# Patient Record
Sex: Male | Born: 1970 | Race: Black or African American | Hispanic: No | Marital: Single | Smoking: Former smoker
Health system: Southern US, Community
[De-identification: ages and names within clinical notes are randomized; demographics above are authoritative.]

## PROBLEM LIST (undated history)

## (undated) ENCOUNTER — Ambulatory Visit: Source: Home / Self Care

---

## 1998-01-29 ENCOUNTER — Emergency Department (HOSPITAL_COMMUNITY): Admission: EM | Admit: 1998-01-29 | Discharge: 1998-01-29 | Payer: Self-pay | Admitting: Emergency Medicine

## 2001-03-01 ENCOUNTER — Emergency Department (HOSPITAL_COMMUNITY): Admission: EM | Admit: 2001-03-01 | Discharge: 2001-03-01 | Payer: Self-pay | Admitting: Emergency Medicine

## 2001-03-01 ENCOUNTER — Encounter: Payer: Self-pay | Admitting: Emergency Medicine

## 2001-03-05 ENCOUNTER — Emergency Department (HOSPITAL_COMMUNITY): Admission: EM | Admit: 2001-03-05 | Discharge: 2001-03-05 | Payer: Self-pay

## 2001-10-26 ENCOUNTER — Emergency Department (HOSPITAL_COMMUNITY): Admission: EM | Admit: 2001-10-26 | Discharge: 2001-10-26 | Payer: Self-pay | Admitting: Emergency Medicine

## 2001-10-26 ENCOUNTER — Encounter: Payer: Self-pay | Admitting: Emergency Medicine

## 2006-03-27 ENCOUNTER — Emergency Department (HOSPITAL_COMMUNITY): Admission: EM | Admit: 2006-03-27 | Discharge: 2006-03-27 | Payer: Self-pay | Admitting: Emergency Medicine

## 2006-05-10 ENCOUNTER — Emergency Department (HOSPITAL_COMMUNITY): Admission: EM | Admit: 2006-05-10 | Discharge: 2006-05-11 | Payer: Self-pay | Admitting: Emergency Medicine

## 2006-10-16 ENCOUNTER — Emergency Department (HOSPITAL_COMMUNITY): Admission: EM | Admit: 2006-10-16 | Discharge: 2006-10-16 | Payer: Self-pay | Admitting: Emergency Medicine

## 2006-10-19 ENCOUNTER — Emergency Department (HOSPITAL_COMMUNITY): Admission: EM | Admit: 2006-10-19 | Discharge: 2006-10-19 | Payer: Self-pay | Admitting: Family Medicine

## 2007-09-06 ENCOUNTER — Ambulatory Visit: Payer: Self-pay | Admitting: Nurse Practitioner

## 2007-10-14 ENCOUNTER — Ambulatory Visit: Payer: Self-pay | Admitting: Internal Medicine

## 2007-11-05 ENCOUNTER — Ambulatory Visit: Payer: Self-pay | Admitting: Internal Medicine

## 2007-11-05 ENCOUNTER — Ambulatory Visit: Payer: Self-pay | Admitting: *Deleted

## 2007-12-17 ENCOUNTER — Encounter (INDEPENDENT_AMBULATORY_CARE_PROVIDER_SITE_OTHER): Payer: Self-pay | Admitting: Nurse Practitioner

## 2007-12-17 ENCOUNTER — Ambulatory Visit: Payer: Self-pay | Admitting: Internal Medicine

## 2007-12-17 LAB — CONVERTED CEMR LAB
AST: 25 units/L (ref 0–37)
Albumin: 4.3 g/dL (ref 3.5–5.2)
BUN: 17 mg/dL (ref 6–23)
Basophils Relative: 0 % (ref 0–1)
CO2: 25 meq/L (ref 19–32)
Calcium: 9.2 mg/dL (ref 8.4–10.5)
Chloride: 106 meq/L (ref 96–112)
Cholesterol: 209 mg/dL — ABNORMAL HIGH (ref 0–200)
HDL: 43 mg/dL (ref >39)
Hemoglobin: 14.6 g/dL (ref 13.0–17.0)
Lymphocytes Relative: 33 % (ref 12–46)
Lymphs Abs: 2.3 10*3/uL (ref 0.7–4.0)
Microalb, Ur: 5.75 mg/dL — ABNORMAL HIGH (ref 0.00–1.89)
Monocytes Relative: 10 % (ref 3–12)
Neutro Abs: 3.9 10*3/uL (ref 1.7–7.7)
Neutrophils Relative %: 56 % (ref 43–77)
Potassium: 4.7 meq/L (ref 3.5–5.3)
RBC: 5.45 M/uL (ref 4.22–5.81)
TSH: 2.235 microintl units/mL (ref 0.350–5.50)
WBC: 6.9 10*3/uL (ref 4.0–10.5)

## 2007-12-20 ENCOUNTER — Encounter (INDEPENDENT_AMBULATORY_CARE_PROVIDER_SITE_OTHER): Payer: Self-pay | Admitting: Nurse Practitioner

## 2007-12-20 LAB — CONVERTED CEMR LAB: Hgb A1c MFr Bld: 6.9 % — ABNORMAL HIGH (ref 4.6–6.1)

## 2008-01-14 ENCOUNTER — Emergency Department (HOSPITAL_COMMUNITY): Admission: EM | Admit: 2008-01-14 | Discharge: 2008-01-14 | Payer: Self-pay | Admitting: Emergency Medicine

## 2008-07-14 ENCOUNTER — Ambulatory Visit: Payer: Self-pay | Admitting: Internal Medicine

## 2008-07-14 ENCOUNTER — Encounter (INDEPENDENT_AMBULATORY_CARE_PROVIDER_SITE_OTHER): Payer: Self-pay | Admitting: Internal Medicine

## 2008-07-14 LAB — CONVERTED CEMR LAB
Barbiturate Quant, Ur: NEGATIVE
Creatinine,U: 378.1 mg/dL
Microalb, Ur: 4.53 mg/dL — ABNORMAL HIGH (ref 0.00–1.89)
Opiate Screen, Urine: NEGATIVE
Phencyclidine (PCP): NEGATIVE
Propoxyphene: NEGATIVE

## 2008-07-24 ENCOUNTER — Ambulatory Visit (HOSPITAL_BASED_OUTPATIENT_CLINIC_OR_DEPARTMENT_OTHER): Admission: RE | Admit: 2008-07-24 | Discharge: 2008-07-24 | Payer: Self-pay | Admitting: Internal Medicine

## 2008-07-29 ENCOUNTER — Ambulatory Visit: Payer: Self-pay | Admitting: Internal Medicine

## 2008-08-15 ENCOUNTER — Encounter (INDEPENDENT_AMBULATORY_CARE_PROVIDER_SITE_OTHER): Payer: Self-pay | Admitting: Internal Medicine

## 2008-08-15 ENCOUNTER — Ambulatory Visit: Payer: Self-pay | Admitting: Internal Medicine

## 2008-08-15 LAB — CONVERTED CEMR LAB
Calcium: 8.9 mg/dL (ref 8.4–10.5)
Creatinine, Ser: 1.24 mg/dL (ref 0.40–1.50)
HDL: 34 mg/dL — ABNORMAL LOW (ref >39)
Triglycerides: 141 mg/dL (ref <150)

## 2009-06-27 ENCOUNTER — Emergency Department (HOSPITAL_COMMUNITY): Admission: EM | Admit: 2009-06-27 | Discharge: 2009-06-27 | Payer: Self-pay | Admitting: Emergency Medicine

## 2009-06-28 ENCOUNTER — Emergency Department (HOSPITAL_COMMUNITY): Admission: EM | Admit: 2009-06-28 | Discharge: 2009-06-29 | Payer: Self-pay | Admitting: Emergency Medicine

## 2009-07-10 ENCOUNTER — Ambulatory Visit: Payer: Self-pay | Admitting: Internal Medicine

## 2010-02-23 ENCOUNTER — Observation Stay (HOSPITAL_COMMUNITY): Admission: EM | Admit: 2010-02-23 | Discharge: 2010-02-24 | Payer: Self-pay | Admitting: Emergency Medicine

## 2010-08-29 ENCOUNTER — Emergency Department (HOSPITAL_COMMUNITY): Admission: EM | Admit: 2010-08-29 | Discharge: 2010-06-03 | Payer: Self-pay | Admitting: Emergency Medicine

## 2010-12-09 LAB — DIFFERENTIAL
Basophils Absolute: 0.1 10*3/uL (ref 0.0–0.1)
Basophils Relative: 1 % (ref 0–1)
Eosinophils Relative: 1 % (ref 0–5)
Lymphocytes Relative: 12 % (ref 12–46)
Monocytes Absolute: 0.9 10*3/uL (ref 0.1–1.0)

## 2010-12-09 LAB — CBC
HCT: 38.4 % — ABNORMAL LOW (ref 39.0–52.0)
Hemoglobin: 12.8 g/dL — ABNORMAL LOW (ref 13.0–17.0)
MCHC: 33.4 g/dL (ref 30.0–36.0)
RDW: 14 % (ref 11.5–15.5)

## 2010-12-09 LAB — BASIC METABOLIC PANEL
CO2: 27 mEq/L (ref 19–32)
Chloride: 107 mEq/L (ref 96–112)
Glucose, Bld: 102 mg/dL — ABNORMAL HIGH (ref 70–99)
Potassium: 3.8 mEq/L (ref 3.5–5.1)
Sodium: 137 mEq/L (ref 135–145)

## 2010-12-09 LAB — URINALYSIS, ROUTINE W REFLEX MICROSCOPIC
Bilirubin Urine: NEGATIVE
Ketones, ur: NEGATIVE mg/dL
Nitrite: NEGATIVE
pH: 8 (ref 5.0–8.0)

## 2010-12-26 LAB — URINALYSIS, ROUTINE W REFLEX MICROSCOPIC
Bilirubin Urine: NEGATIVE
Glucose, UA: NEGATIVE mg/dL
Hgb urine dipstick: NEGATIVE
Specific Gravity, Urine: 1.022 (ref 1.005–1.030)
Urobilinogen, UA: 0.2 mg/dL (ref 0.0–1.0)
pH: 5.5 (ref 5.0–8.0)

## 2010-12-26 LAB — DIFFERENTIAL
Basophils Relative: 1 % (ref 0–1)
Eosinophils Absolute: 0.2 10*3/uL (ref 0.0–0.7)
Eosinophils Relative: 2 % (ref 0–5)
Monocytes Absolute: 0.8 10*3/uL (ref 0.1–1.0)
Monocytes Relative: 10 % (ref 3–12)

## 2010-12-26 LAB — POCT I-STAT, CHEM 8
Calcium, Ion: 1.19 mmol/L (ref 1.12–1.32)
Creatinine, Ser: 1.2 mg/dL (ref 0.4–1.5)
Glucose, Bld: 101 mg/dL — ABNORMAL HIGH (ref 70–99)
HCT: 43 % (ref 39.0–52.0)
Hemoglobin: 14.6 g/dL (ref 13.0–17.0)

## 2010-12-26 LAB — CBC
HCT: 40 % (ref 39.0–52.0)
Hemoglobin: 13.3 g/dL (ref 13.0–17.0)
MCHC: 33.2 g/dL (ref 30.0–36.0)
MCV: 83.7 fL (ref 78.0–100.0)
RBC: 4.78 MIL/uL (ref 4.22–5.81)

## 2011-02-04 NOTE — Procedures (Signed)
NAME:  Andre Weiss, Andre Weiss NO.:  [id number]   MEDICAL RECORD NO.:  [id number]          PATIENT TYPE:  OUT   LOCATION:  SLEEP CENTER                 FACILITY:  [facility]   PHYSICIAN:  Clinton D. Maple Hudson, MD, FCCP, FACPDATE OF BIRTH:  12-May[birth year]1970   DATE OF STUDY:  07/24/2008                            NOCTURNAL POLYSOMNOGRAM   REFERRING PHYSICIAN:  Dineen Kid. Reche Dixon, M.D.   REFERRING PHYSICIAN:  Dr. Dineen Kid. Reche Dixon, MD   INDICATION FOR STUDY:  Hypersomnia with sleep apnea.   EPWORTH SLEEPINESS SCORE:  Epworth sleepiness score 6/24.  BMI 35.8.  Weight 260 pounds.  Height 71.5 inches.  Neck 18.5 inches.   MEDICATIONS:  Home medications are charted and reviewed.   SLEEP ARCHITECTURE:  Split study protocol.  During the diagnostic phase  total sleep time was 131 minutes with sleep efficiency 89.8%.  Stage I  was 4.6%. Stage II  94.7%.  Stage III 0.8%.  REM absent.  Sleep latency  is 14.5 minutes.  Wake after sleep onset 1 minute.  Arousal index 76.7  indicating increased EEG arousal.  No bedtime medication was reported.   RESPIRATORY DATA:  Split study protocol.  Apnea-hypopnea index (AHI)  107.2 per hour.  A total of 235 events were scored including 84  obstructive apneas and 151 hypopneas.  All events were listed as being  nonsupine.  REM AHI N/A.  CPAP was titrated to 14 CWP, AHI 0 per hour.  He chose a medium ResMed full-face mask with heated humidifier.   OXYGEN DATA:  Loud snoring before CPAP with oxygen desaturation to a  nadir of 74%.  After CPAP titration mean oxygen saturation held 97.4% on  room air.   CARDIAC DATA:  Normal sinus rhythm.   MOVEMENT/PARASOMNIA:  Insignificant movement disturbance.  Bathroom x1.   IMPRESSIONS/RECOMMENDATIONS:  1. Severe obstructive sleep apnea/hypopnea syndrome, apnea-hypopnea      index 107.2 per hour with events while nonsupine, loud snoring, and      oxygen desaturation to a nadir of 74%.  2. Successful continuous positive  airway pressure titration to 14      centimeters of water pressure, apnea-hypopnea index 0 per hour.  He      chose a medium ResMed full-face mask with heated humidifier.      Clinton D. Maple Hudson, MD, [facility], FACP  Diplomate, Biomedical engineer of Sleep Medicine  Electronically Signed     CDY/MEDQ  D:  07/29/2008 16:10:07  T:  07/30/2008 01:32:36  Job:  [id number]

## 2012-03-30 ENCOUNTER — Emergency Department (HOSPITAL_COMMUNITY)
Admission: EM | Admit: 2012-03-30 | Discharge: 2012-03-31 | Disposition: A | Discharge status: Home / Self Care | Payer: Self-pay | Attending: Emergency Medicine | Admitting: Emergency Medicine

## 2012-03-30 ENCOUNTER — Encounter (HOSPITAL_COMMUNITY): Payer: Self-pay | Admitting: *Deleted

## 2012-03-30 DIAGNOSIS — H109 Unspecified conjunctivitis: Secondary | ICD-10-CM | POA: Insufficient documentation

## 2012-03-30 DIAGNOSIS — H53149 Visual discomfort, unspecified: Secondary | ICD-10-CM | POA: Insufficient documentation

## 2012-03-30 DIAGNOSIS — H5789 Other specified disorders of eye and adnexa: Secondary | ICD-10-CM | POA: Insufficient documentation

## 2012-03-30 DIAGNOSIS — H5710 Ocular pain, unspecified eye: Secondary | ICD-10-CM | POA: Insufficient documentation

## 2012-03-30 MED ORDER — TOBRAMYCIN 0.3 % OP SOLN
2.0000 [drp] | OPHTHALMIC | Status: DC
  Administered 2012-03-31: 2 [drp] via OPHTHALMIC
  Filled 2012-03-30: qty 5

## 2012-03-30 MED ORDER — FLUORESCEIN SODIUM 1 MG OP STRP
ORAL_STRIP | OPHTHALMIC | Status: AC
  Administered 2012-03-30
  Filled 2012-03-30: qty 2

## 2012-03-30 MED ORDER — TETRACAINE HCL 0.5 % OP SOLN
2.0000 [drp] | Freq: Once | OPHTHALMIC | Status: AC
  Administered 2012-03-30: 2 [drp] via OPHTHALMIC
  Filled 2012-03-30: qty 2

## 2012-03-30 NOTE — ED Provider Notes (Signed)
History     CSN: [id number]  Arrival date & time 03/30/12  2019   First MD Initiated Contact with Patient 03/30/12 2233      Chief Complaint  Patient presents with  . Eye Pain   HPI  History provided by the patient. Patient is a 41 year old male with no significant past medical history who presents with complaints of left eye pain and redness for the past day. Patient does not recall having any foreign body in the high. He does report using insecticide spray 2 days ago but did not have any eye complaints that day. Pain is worse with bright lights and occasionally with eye movements. Patient has otherwise felt well. Symptoms are also associated with redness to the eye and tearing and discharge. Symptoms have not been associated with any double vision or blurry vision. He denies any nasal congestion, rhinorrhea, cough, fever, chills.    History reviewed. No pertinent past medical history.  History reviewed. No pertinent past surgical history.  History reviewed. No pertinent family history.  History  Substance Use Topics  . Smoking status: Never Smoker   . Smokeless tobacco: Not on file  . Alcohol Use: No      Review of Systems  Constitutional: Negative for fever and chills.  Eyes: Positive for photophobia, pain, discharge and redness. Negative for itching and visual disturbance.  Gastrointestinal: Negative for nausea and vomiting.  Neurological: Negative for dizziness, light-headedness and headaches.    Allergies  Review of patient's allergies indicates no known allergies.  Home Medications   Current Outpatient Rx  Name Route Sig Dispense Refill  . ADULT MULTIVITAMIN W/MINERALS CH Oral Take 1 tablet by mouth daily.      BP 161/97  Pulse 81  Temp 97.9 F (36.6 C) (Oral)  Resp 19  SpO2 100%  Physical Exam  Nursing note and vitals reviewed. Constitutional: He is oriented to person, place, and time. He appears well-developed and well-nourished.  HENT:  Head:  Normocephalic.  Eyes: EOM are normal. Pupils are equal, round, and reactive to light. No foreign bodies found. Left eye exhibits discharge. Left eye exhibits no chemosis. No foreign body present in the left eye. Left conjunctiva is injected. Left conjunctiva has no hemorrhage.  Slit lamp exam:      The left eye shows no corneal abrasion, no corneal flare, no corneal ulcer, no foreign body, no hyphema and no fluorescein uptake.  Cardiovascular: Normal rate and regular rhythm.   Pulmonary/Chest: Effort normal and breath sounds normal.  Abdominal: Soft.  Neurological: He is alert and oriented to person, place, and time.  Skin: Skin is warm.  Psychiatric: He has a normal mood and affect. His behavior is normal.    ED Course  Procedures     1. Conjunctivitis of left eye       MDM  11:05PM patient seen and evaluated. Patient no acute distress.        Angus Seller, [place] 03/31/12 [phone]

## 2012-03-30 NOTE — ED Notes (Signed)
Pt c/o left eye pain and photophobia, redness and tearing up.  Denies HA, n/v, SOB, CP.

## 2012-03-31 MED ORDER — HYDROCODONE-ACETAMINOPHEN 5-325 MG PO TABS: 1.0000 | ORAL_TABLET | Freq: Four times a day (QID) | ORAL | Status: AC | PRN

## 2012-03-31 MED ORDER — HYDROCODONE-ACETAMINOPHEN 5-325 MG PO TABS
1.0000 | ORAL_TABLET | Freq: Once | ORAL | Status: AC
  Administered 2012-03-31: 1 via ORAL
  Filled 2012-03-31: qty 1

## 2012-03-31 NOTE — ED Provider Notes (Signed)
Medical screening examination/treatment/procedure(s) were performed by non-physician practitioner and as supervising physician I was immediately available for consultation/collaboration.    Jayvin Hurrell R Kirbie Stodghill, MD 03/31/12 1504 

## 2012-04-23 ENCOUNTER — Emergency Department (HOSPITAL_COMMUNITY): Payer: No Typology Code available for payment source

## 2012-04-23 ENCOUNTER — Emergency Department (HOSPITAL_COMMUNITY)
Admission: EM | Admit: 2012-04-23 | Discharge: 2012-04-23 | Disposition: A | Discharge status: Home / Self Care | Payer: No Typology Code available for payment source | Attending: Emergency Medicine | Admitting: Emergency Medicine

## 2012-04-23 ENCOUNTER — Encounter (HOSPITAL_COMMUNITY): Payer: Self-pay | Admitting: *Deleted

## 2012-04-23 DIAGNOSIS — M545 Low back pain, unspecified: Secondary | ICD-10-CM

## 2012-04-23 DIAGNOSIS — M25512 Pain in left shoulder: Secondary | ICD-10-CM

## 2012-04-23 DIAGNOSIS — Y92410 Unspecified street and highway as the place of occurrence of the external cause: Secondary | ICD-10-CM | POA: Insufficient documentation

## 2012-04-23 DIAGNOSIS — M25519 Pain in unspecified shoulder: Secondary | ICD-10-CM | POA: Insufficient documentation

## 2012-04-23 DIAGNOSIS — M546 Pain in thoracic spine: Secondary | ICD-10-CM

## 2012-04-23 MED ORDER — MORPHINE SULFATE 4 MG/ML IJ SOLN
4.0000 mg | Freq: Once | INTRAMUSCULAR | Status: AC
  Administered 2012-04-23: 4 mg via INTRAMUSCULAR
  Filled 2012-04-23: qty 1

## 2012-04-23 MED ORDER — OXYCODONE-ACETAMINOPHEN 5-325 MG PO TABS: 1.0000 | ORAL_TABLET | ORAL | Status: AC | PRN

## 2012-04-23 MED ORDER — MORPHINE SULFATE 4 MG/ML IJ SOLN: 4.0000 mg | Freq: Once | INTRAMUSCULAR | Status: DC

## 2012-04-23 MED ORDER — ONDANSETRON 4 MG PO TBDP
8.0000 mg | ORAL_TABLET | Freq: Once | ORAL | Status: AC
  Administered 2012-04-23: 8 mg via ORAL
  Filled 2012-04-23: qty 2

## 2012-04-23 MED ORDER — SODIUM CHLORIDE 0.9 % IV SOLN: Freq: Once | INTRAVENOUS | Status: DC

## 2012-04-23 MED ORDER — ACETAMINOPHEN 325 MG PO TABS
975.0000 mg | ORAL_TABLET | Freq: Once | ORAL | Status: DC
  Filled 2012-04-23: qty 3

## 2012-04-23 MED ORDER — ONDANSETRON HCL 4 MG/2ML IJ SOLN: 4.0000 mg | Freq: Once | INTRAMUSCULAR | Status: DC

## 2012-04-23 NOTE — ED Notes (Signed)
c-collar removed by PA, HOB raised to 15 degrees per pt request, lights dimmed.

## 2012-04-23 NOTE — ED Notes (Signed)
Pt declined acetaminophen. Requested "something stronger, at least ibuprofen", "my pain is 10/10 & has been since arrival" (a change from initial assessment with this RN), alert, NAD, calm, interactive, slow to come up with date (stated July). Son at [facility]. Pt will call for ride.

## 2012-04-23 NOTE — ED Notes (Signed)
To x-ray via stretcher.
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
## 2012-04-24 NOTE — ED Provider Notes (Signed)
Medical screening examination/treatment/procedure(s) were performed by non-physician practitioner and as supervising physician I was immediately available for consultation/collaboration.  Martha K Linker, MD 04/24/12 1748 

## 2012-04-29 ENCOUNTER — Emergency Department (INDEPENDENT_AMBULATORY_CARE_PROVIDER_SITE_OTHER)
Admission: EM | Admit: 2012-04-29 | Discharge: 2012-04-29 | Disposition: A | Discharge status: Home / Self Care | Payer: Self-pay | Source: Home / Self Care | Attending: Emergency Medicine | Admitting: Emergency Medicine

## 2012-04-29 ENCOUNTER — Encounter (HOSPITAL_COMMUNITY): Payer: Self-pay | Admitting: *Deleted

## 2012-04-29 DIAGNOSIS — IMO0002 Reserved for concepts with insufficient information to code with codable children: Secondary | ICD-10-CM

## 2012-04-29 DIAGNOSIS — T148XXA Other injury of unspecified body region, initial encounter: Secondary | ICD-10-CM

## 2012-04-29 MED ORDER — HYDROCODONE-ACETAMINOPHEN 5-500 MG PO TABS: 1.0000 | ORAL_TABLET | Freq: Four times a day (QID) | ORAL | Status: AC | PRN

## 2012-04-29 MED ORDER — CYCLOBENZAPRINE HCL 10 MG PO TABS: 10.0000 mg | ORAL_TABLET | Freq: Every evening | ORAL | Status: AC | PRN
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
## 2014-03-24 ENCOUNTER — Emergency Department (HOSPITAL_COMMUNITY): Payer: No Typology Code available for payment source

## 2014-03-24 ENCOUNTER — Emergency Department (HOSPITAL_COMMUNITY)
Admission: EM | Admit: 2014-03-24 | Discharge: 2014-03-24 | Disposition: A | Discharge status: Home / Self Care | Payer: No Typology Code available for payment source | Attending: Emergency Medicine | Admitting: Emergency Medicine

## 2014-03-24 ENCOUNTER — Encounter (HOSPITAL_COMMUNITY): Payer: Self-pay | Admitting: Emergency Medicine

## 2014-03-24 DIAGNOSIS — Y9389 Activity, other specified: Secondary | ICD-10-CM | POA: Insufficient documentation

## 2014-03-24 DIAGNOSIS — Z79899 Other long term (current) drug therapy: Secondary | ICD-10-CM | POA: Insufficient documentation

## 2014-03-24 DIAGNOSIS — S93409A Sprain of unspecified ligament of unspecified ankle, initial encounter: Secondary | ICD-10-CM | POA: Insufficient documentation

## 2014-03-24 DIAGNOSIS — S93402A Sprain of unspecified ligament of left ankle, initial encounter: Secondary | ICD-10-CM

## 2014-03-24 DIAGNOSIS — X500XXA Overexertion from strenuous movement or load, initial encounter: Secondary | ICD-10-CM | POA: Insufficient documentation

## 2014-03-24 DIAGNOSIS — Y929 Unspecified place or not applicable: Secondary | ICD-10-CM | POA: Insufficient documentation

## 2014-03-24 MED ORDER — OXYCODONE-ACETAMINOPHEN 5-325 MG PO TABS
1.0000 | ORAL_TABLET | Freq: Four times a day (QID) | ORAL | Status: DC | PRN
Start: 2014-03-24 — End: 2014-05-22

## 2014-03-24 MED ORDER — KETOROLAC TROMETHAMINE 60 MG/2ML IM SOLN
60.0000 mg | Freq: Once | INTRAMUSCULAR | Status: AC
  Administered 2014-03-24: 60 mg via INTRAMUSCULAR
  Filled 2014-03-24: qty 2
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
## 2014-03-24 NOTE — Discharge Instructions (Signed)
Wear ankle brace for at least 2 weeks for stabilization of ankle. Use crutches as needed for comfort, start weight bearing over the next week. Ice and elevate ankle throughout the day. Alternate between ibuprofen and Percocet for pain relief. Do not drive or operate machinery with pain medication use. Call orthopedic follow up today or tomorrow to schedule followup appointment in 2 weeks for recheck of ongoing ankle pain that can be canceled with a 24-48 hour notice if complete resolution of pain. Follow up with your primary care doctor in 1 week for further evaluation.   Ankle Sprain An ankle sprain is an injury to the strong, fibrous tissues (ligaments) that hold your ankle bones together.  HOME CARE   Put ice on your ankle for 1-2 days or as told by your doctor.  Put ice in a plastic bag.  Place a towel between your skin and the bag.  Leave the ice on for 15-20 minutes at a time, every 2 hours while you are awake.  Only take medicine as told by your doctor.  Raise (elevate) your injured ankle above the level of your heart as much as possible for 2-3 days.  Use crutches if your doctor tells you to. Slowly put your own weight on the affected ankle. Use the crutches until you can walk without pain.  If you have a plaster splint:  Do not rest it on anything harder than a pillow for 24 hours.  Do not put weight on it.  Do not get it wet.  Take it off to shower or bathe.  If given, use an elastic wrap or support stocking for support. Take the wrap off if your toes lose feeling (numb), tingle, or turn cold or blue.  If you have an air splint:  Add or let out air to make it comfortable.  Take it off at night and to shower and bathe.  Wiggle your toes and move your ankle up and down often while you are wearing it. GET HELP RIGHT AWAY IF:   Your toes lose feeling (numb) or turn blue.  You have severe pain that is increasing.  You have rapidly increasing bruising or puffiness  (swelling).  Your toes feel very cold.  You lose feeling in your foot.  Your medicine does not help your pain. MAKE SURE YOU:   Understand these instructions.  Will watch your condition.  Will get help right away if you are not doing well or get worse. Document Released: 02/25/2008 Document Revised: 06/02/2012 Document Reviewed: 03/22/2012 [facility] Patient Information 2015 [place]ExitCare, [place]LL[place]. This information is not intended to replace advice given to you by your health care provider. Make sure you discuss any questions you have with your health care provider.  Cryotherapy Cryotherapy means treatment with cold. Ice or gel packs can be used to reduce both pain and swelling. Ice is the most helpful within the first 24 to 48 hours after an injury or flareup from overusing a muscle or joint. Sprains, strains, spasms, burning pain, shooting pain, and aches can all be eased with ice. Ice can also be used when recovering from surgery. Ice is effective, has very few side effects, and is safe for most people to use. PRECAUTIONS  Ice is not a safe treatment option for people with:  Raynaud's phenomenon. This is a condition affecting small blood vessels in the extremities. Exposure to cold may cause your problems to return.  Cold hypersensitivity. There are many forms of cold hypersensitivity, including:  Cold  urticaria. Red, itchy hives appear on the skin when the tissues begin to warm after being iced.  Cold erythema. This is a red, itchy rash caused by exposure to cold.  Cold hemoglobinuria. Red blood cells break down when the tissues begin to warm after being iced. The hemoglobin that carry oxygen are passed into the urine because they cannot combine with blood proteins fast enough.  Numbness or altered sensitivity in the area being iced. If you have any of the following conditions, do not use ice until you have discussed cryotherapy with your caregiver:  Heart conditions, such as  arrhythmia, angina, or chronic heart disease.  High blood pressure.  Healing wounds or open skin in the area being iced.  Current infections.  Rheumatoid arthritis.  Poor circulation.  Diabetes. Ice slows the blood flow in the region it is applied. This is beneficial when trying to stop inflamed tissues from spreading irritating chemicals to surrounding tissues. However, if you expose your skin to cold temperatures for too long or without the proper protection, you can damage your skin or nerves. Watch for signs of skin damage due to cold. HOME CARE INSTRUCTIONS Follow these tips to use ice and cold packs safely.  Place a dry or damp towel between the ice and skin. A damp towel will cool the skin more quickly, so you may need to shorten the time that the ice is used.  For a more rapid response, add gentle compression to the ice.  Ice for no more than 10 to 20 minutes at a time. The bonier the area you are icing, the less time it will take to get the benefits of ice.  Check your skin after 5 minutes to make sure there are no signs of a poor response to cold or skin damage.  Rest 20 minutes or more in between uses.  Once your skin is numb, you can end your treatment. You can test numbness by very lightly touching your skin. The touch should be so light that you do not see the skin dimple from the pressure of your fingertip. When using ice, most people will feel these normal sensations in this order: cold, burning, aching, and numbness.  Do not use ice on someone who cannot communicate their responses to pain, such as small children or people with dementia. HOW TO MAKE AN ICE PACK Ice packs are the most common way to use ice therapy. Other methods include ice massage, ice baths, and cryo-sprays. Muscle creams that cause a cold, tingly feeling do not offer the same benefits that ice offers and should not be used as a substitute unless recommended by your caregiver. To make an ice pack, do  one of the following:  Place crushed ice or a bag of frozen vegetables in a sealable plastic bag. Squeeze out the excess air. Place this bag inside another plastic bag. Slide the bag into a pillowcase or place a damp towel between your skin and the bag.  Mix 3 parts water with 1 part rubbing alcohol. Freeze the mixture in a sealable plastic bag. When you remove the mixture from the freezer, it will be slushy. Squeeze out the excess air. Place this bag inside another plastic bag. Slide the bag into a pillowcase or place a damp towel between your skin and the bag. SEEK MEDICAL CARE IF:  You develop white spots on your skin. This may give the skin a blotchy (mottled) appearance.  Your skin turns blue or pale.  Your skin  becomes waxy or hard.  Your swelling gets worse. MAKE SURE YOU:   Understand these instructions.  Will watch your condition.  Will get help right away if you are not doing well or get worse. Document Released: 05/05/2011 Document Revised: 12/01/2011 Document Reviewed: 05/05/2011 [facility] Patient Information 2015 [place]Care, [place]. This information is not intended to replace advice given to you by your health care provider. Make sure you discuss any questions you have with your health care provider.  Ligament Sprain Ligaments are tough, fibrous tissues that hold bones together at the joints. A sprain can occur when a ligament is stretched. This injury may take several weeks to heal. HOME CARE INSTRUCTIONS   Rest the injured area for as long as directed by your caregiver. Then slowly start using the joint as directed by your caregiver and as the pain allows.  Keep the affected joint raised if possible to lessen swelling.  Apply ice for 15-20 minutes to the injured area every couple hours for the first half day, then 03-04 times per day for the first 48 hours. Put the ice in a plastic bag and place a towel between the bag of ice and your skin.  Wear any splinting, casting,  or elastic bandage applications as instructed.  Only take over-the-counter or prescription medicines for pain, discomfort, or fever as directed by your caregiver. Do not use aspirin immediately after the injury unless instructed by your caregiver. Aspirin can cause increased bleeding and bruising of the tissues.  If you were given crutches, continue to use them as instructed and do not resume weight bearing on the affected extremity until instructed. SEEK MEDICAL CARE IF:   Your bruising, swelling, or pain increases.  You have cold and numb fingers or toes if your arm or leg was injured. SEEK IMMEDIATE MEDICAL CARE IF:   Your toes are numb or blue if your leg was injured.  Your fingers are numb or blue if your arm was injured.  Your pain is not responding to medicines and continues to stay the same or gets worse. MAKE SURE YOU:   Understand these instructions.  Will watch your condition.  Will get help right away if you are not doing well or get worse. Document Released: 09/05/2000 Document Revised: 12/01/2011 Document Reviewed: 07/04/2008 [facility] Patient Information 2015 [place]eleExitCare, [place]LLC. This information is not intended to replace advice given to you by your health care provider. Make sure you discuss any questions you have with your health care provider.

## 2014-03-24 NOTE — ED Provider Notes (Signed)
CSN: [id number]     Arrival date & time 03/24/14  1013 History   First MD Initiated Contact with Patient 03/24/14 1054     Chief Complaint  Patient presents with  . Ankle Pain     (Consider location/radiation/quality/duration/timing/severity/associated sxs/prior Treatment) HPI Comments: Andre Weiss is a 43 y.o. Male with no significant PMHx presents today with L ankle pain. Pt states he came down hard on it, twisting it, but was able to ambulate immediately. Awoke this morning with increased swelling and inability to put pressure down. Moderate pain, aching, non-radiating, worse with wt bearing, no known alleviating factors. Has torn tendons in this ankle before. Denies paresthesias, numbness, weakness, erythema or warmth. No wounds or punctures.  Patient is a 43 y.o. male presenting with ankle pain. The history is provided by the patient. No language interpreter was used.  Ankle Pain Time since incident:  1 day Injury: yes   Mechanism of injury: fall   Pain details:    Quality:  Aching   Radiates to:  Does not radiate   Severity:  Moderate   Onset quality:  Sudden   Duration:  1 day   Timing:  Constant   Progression:  Unchanged Chronicity:  New Dislocation: no   Foreign body present:  No foreign bodies Prior injury to area:  Yes Relieved by:  None tried Worsened by:  Bearing weight Ineffective treatments:  None tried Associated symptoms: swelling   Associated symptoms: no back pain, no decreased ROM, no fever, no muscle weakness, no neck pain, no numbness, no stiffness and no tingling     History reviewed. No pertinent past medical history. History reviewed. No pertinent past surgical history. No family history on file. History  Substance Use Topics  . Smoking status: Never Smoker   . Smokeless tobacco: Not on file  . Alcohol Use: No    Review of Systems  Constitutional: Negative for fever and chills.  Musculoskeletal: Positive for arthralgias (L ankle) and joint  swelling (L ankle). Negative for back pain, myalgias, neck pain, neck stiffness and stiffness.  Skin: Negative for color change and wound.  Neurological: Negative for weakness and numbness.      Allergies  Review of patient's allergies indicates no known allergies.  Home Medications   Prior to Admission medications   Medication Sig Start Date End Date Taking? Authorizing Provider  ibuprofen (ADVIL,MOTRIN) 200 MG tablet Take 400 mg by mouth every 6 (six) hours as needed for mild pain.   Yes Historical Provider, MD  Multiple Vitamin (MULTIVITAMIN WITH MINERALS) TABS Take 1 tablet by mouth daily.   Yes Historical Provider, MD  oxyCODONE-acetaminophen (PERCOCET) 5-325 MG per tablet Take 1-2 tablets by mouth every 6 (six) hours as needed. 03/24/14   Tashon Capp Strupp Camprubi-Soms, PA-C   BP 150/91  Pulse 92  Temp(Src) 99 F (37.2 C) (Oral)  Resp 17  SpO2 100% Physical Exam  Nursing note and vitals reviewed. Constitutional: He is oriented to person, place, and time. Vital signs are normal. He appears well-developed and well-nourished. No distress.  HENT:  Head: Normocephalic and atraumatic.  Eyes: Conjunctivae and EOM are normal.  Neck: Normal range of motion. Neck supple.  Cardiovascular: Normal rate.   Pulmonary/Chest: Effort normal.  Musculoskeletal: Normal range of motion.       Left ankle: He exhibits swelling. He exhibits normal range of motion. Tenderness. AITFL and CF ligament tenderness found. Achilles tendon exhibits normal Thompson's test results.  L ankle TTP along lateral ligaments, moderate  swelling to the area, no malleoli TTP. ROM intact, although painful. No erythema or red streaking. No warmth to the joint.   Neurological: He is alert and oriented to person, place, and time. He has normal strength. No sensory deficit.  Strength 5/5 in b/l LE's, sensation grossly intact.  Skin: Skin is warm, dry and intact. No erythema.  Psychiatric: He has a normal mood and affect.     ED Course  Procedures (including critical care time) Labs Review Labs Reviewed - No data to display  Imaging Review Dg Ankle Complete Left  03/24/2014   CLINICAL DATA:  Left ankle pain.  EXAM: LEFT ANKLE COMPLETE - 3+ VIEW  COMPARISON:  None.  FINDINGS: The ankle mortise is maintained. No acute ankle fracture or osteochondral lesion. Mild degenerative changes noted. Calcification between the distal fibula and talus could be due to previous trauma.  The mid and hindfoot bony structures are intact.  IMPRESSION: No acute ankle fracture.  Mild degenerative changes.   Electronically Signed   By: Loralie ChampagneMark  Gallerani M.D.   On: 03/24/2014 11:13     EKG Interpretation None      MDM   Final diagnoses:  Ankle sprain, left, initial encounter    Andre Weiss is a 43 y.o. male presenting s/p twisting of L ankle. Exam demonstrating swelling with no erythema or red streaking, no warmth to the joint. Low clinical suspicion for septic or gouty joint. Xray neg for fx, shows degenerative changes. Likely sprain with underlying arthritis. Will place in ace wrap with air cast for support, crutches for 1 wk. Norco for pain. F/up with PCP and ortho. I explained the diagnosis and have given explicit precautions to return to the ER including for any other new or worsening symptoms. The patient understands and accepts the medical plan as it's been dictated and I have answered their questions. Discharge instructions concerning home care and prescriptions have been given. The patient is STABLE and is discharged to home in good condition.  BP 150/91  Pulse 92  Temp(Src) 99 F (37.2 C) (Oral)  Resp 17  SpO2 100%    Karma Ansley Strupp Camprubi-Soms, PA-C 03/24/14 1255

## 2014-03-24 NOTE — ED Provider Notes (Signed)
Medical screening examination/treatment/procedure(s) were performed by non-physician practitioner and as supervising physician I was immediately available for consultation/collaboration.  Flint MelterElliott L Lua Feng, MD 03/24/14 [phone]

## 2014-03-24 NOTE — Progress Notes (Addendum)
Orthopedic Tech Progress Note Patient Details:  Andre HansenJames E Lenn 07/25/1971 [id number] Applied ACE bandage to Lt. foot and ankle.  Pulses, sensation, motion intact before and after application.  Capillary refill less than 2 seconds before and after application. Fitted crutches and taught pt. use of same. Ortho Devices Type of Ortho Device: Ace wrap;Crutches Ortho Device/Splint Location: LLE Ortho Device/Splint Interventions: Application   [name], [name] L 03/24/2014, 11:51 AM

## 2014-03-28 ENCOUNTER — Encounter (HOSPITAL_COMMUNITY): Payer: Self-pay | Admitting: Emergency Medicine

## 2014-03-28 ENCOUNTER — Emergency Department (HOSPITAL_COMMUNITY)
Admission: EM | Admit: 2014-03-28 | Discharge: 2014-03-29 | Disposition: A | Discharge status: Home / Self Care | Payer: No Typology Code available for payment source | Attending: Emergency Medicine | Admitting: Emergency Medicine

## 2014-03-28 DIAGNOSIS — G8911 Acute pain due to trauma: Secondary | ICD-10-CM | POA: Insufficient documentation

## 2014-03-28 DIAGNOSIS — L089 Local infection of the skin and subcutaneous tissue, unspecified: Secondary | ICD-10-CM | POA: Insufficient documentation

## 2014-03-28 DIAGNOSIS — M25476 Effusion, unspecified foot: Secondary | ICD-10-CM | POA: Insufficient documentation

## 2014-03-28 DIAGNOSIS — M25473 Effusion, unspecified ankle: Secondary | ICD-10-CM | POA: Insufficient documentation

## 2014-03-28 DIAGNOSIS — Z8614 Personal history of Methicillin resistant Staphylococcus aureus infection: Secondary | ICD-10-CM | POA: Insufficient documentation

## 2014-03-28 NOTE — ED Notes (Signed)
Pt was seen here last week for the same, he states that his foot still hurts and its not getting any better

## 2014-03-28 NOTE — ED Provider Notes (Signed)
CSN: [id number]     Arrival date & time 03/28/14  2342 History  This chart was scribed for non-physician practitioner, Ivonne AndrewPeter Merisa Julio, PA-C,working with Rolland PorterMark Stevon, MD, by Karle PlumberJennifer Tensley, ED Scribe.  This patient was seen in room WTR7/WTR7 and the patient's care was started at 11:58 PM.   Chief Complaint  Patient presents with  . Foot Injury   The history is provided by the patient. No language interpreter was used.   HPI Comments:  Andre HansenJames E Harvel is a 43 y.o. 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 He was seen here last week but states the ankle is not getting any better. Pt reports scraping his skin also trying to get "dead skin" off and complains of pain due to that. He states he feels that it may now be infected. He reports associated swelling. Pt states he has been prescribed Percocet but it is not helping. He denies any new injury, trauma, or fall. He denies fever, chills, or numbness or the left foot. He denies any allergies to any medication.  History reviewed. No pertinent past medical history. History reviewed. No pertinent past surgical history. History reviewed. No pertinent family history. History  Substance Use Topics  . Smoking status: Never Smoker   . Smokeless tobacco: Not on file  . Alcohol Use: No    Review of Systems  Constitutional: Negative for chills.  Musculoskeletal: Positive for arthralgias.  Skin: Positive for wound.  Neurological: Negative for numbness.  All other systems reviewed and are negative.  Allergies  Review of patient's allergies indicates no known allergies.  Home Medications   Prior to Admission medications   Medication Sig Start Date End Date Taking? Authorizing Provider  ibuprofen (ADVIL,MOTRIN) 200 MG tablet Take 400 mg by mouth every 6 (six) hours as needed for mild pain.     Historical Provider, MD  Multiple Vitamin (MULTIVITAMIN WITH MINERALS) TABS Take 1 tablet by mouth daily.    Historical Provider, MD  oxyCODONE-acetaminophen (PERCOCET) 5-325 MG per tablet Take 1-2 tablets by mouth every 6 (six) hours as needed. 03/24/14   Mercedes Strupp Camprubi-Soms, PA-C   Triage Vitals: BP 143/84  Pulse 109  Temp(Src) 98.4 F (36.9 C) (Oral)  Resp 18  SpO2 98% Physical Exam  Nursing note and vitals reviewed. Constitutional: He is oriented to person, place, and time. He appears well-developed and well-nourished. No distress.  HENT:  Head: Normocephalic and atraumatic.  Eyes: EOM are normal.  Neck: Normal range of motion.  Cardiovascular: Normal rate.   No murmur heard. Pulmonary/Chest: Effort normal. No respiratory distress. He has no wheezes.  Musculoskeletal: Normal range of motion.  Diffuse swelling of the left foot and into the ankle with tenderness. There is a thickened crusting skin to the medial left heel with some surrounding fluid filled bulla. No erythematous streaks up the leg. Normal distal sensations toes and capillary refill.  Neurological: He is alert and oriented to person, place, and time.  Skin: Skin is warm and dry.  Psychiatric: He has a normal mood and affect. His behavior is normal.    ED Course  Procedures  DIAGNOSTIC STUDIES: Oxygen Saturation is 98% on RA, normal by my interpretation.   COORDINATION OF CARE: 12:17 AM- Will send cultures of foot wound. Will X-Ray left ankle/foot and order antibiotics and refer to wound center and orthopedist.  Pt verbalizes understanding and agrees to plan.   Imaging Review Dg Foot Complete Left  03/29/2014   CLINICAL DATA:  Foot injury, pain, and swelling.  EXAM: LEFT FOOT - COMPLETE 3+ VIEW  COMPARISON:  None.  FINDINGS: There is no evidence of fracture or dislocation. There is no evidence of arthropathy or other focal bone abnormality. Mild soft tissue swelling seen along the dorsal aspect of the  metatarsals.  IMPRESSION: Mild dorsal soft tissue swelling.  No acute fracture identified.   Electronically Signed   By: Myles RosenthalJohn  Stahl M.D.   On: 03/29/2014 01:15     MDM   Final diagnoses:  Left foot infection      I personally performed the services described in this documentation, which was scribed in my presence. The recorded information has been reviewed and is accurate.    Angus SellerPeter S Uriyah Massimo, PA-C 03/29/14 0145

## 2014-03-29 ENCOUNTER — Emergency Department (HOSPITAL_COMMUNITY): Payer: No Typology Code available for payment source

## 2014-03-29 MED ORDER — CEPHALEXIN 500 MG PO CAPS: 500.0000 mg | ORAL_CAPSULE | Freq: Four times a day (QID) | ORAL | Status: DC

## 2014-03-29 MED ORDER — OXYCODONE-ACETAMINOPHEN 5-325 MG PO TABS
2.0000 | ORAL_TABLET | Freq: Once | ORAL | Status: AC
  Administered 2014-03-29: 2 via ORAL
  Filled 2014-03-29: qty 2

## 2014-03-29 MED ORDER — CEPHALEXIN 500 MG PO CAPS
500.0000 mg | ORAL_CAPSULE | Freq: Once | ORAL | Status: AC
  Administered 2014-03-29: 500 mg via ORAL
  Filled 2014-03-29: qty 1

## 2014-03-29 MED ORDER — OXYCODONE-ACETAMINOPHEN 10-325 MG PO TABS: 1.0000 | ORAL_TABLET | ORAL | Status: DC | PRN

## 2014-03-29 MED ORDER — SULFAMETHOXAZOLE-TRIMETHOPRIM 800-160 MG PO TABS: 1.0000 | ORAL_TABLET | Freq: Two times a day (BID) | ORAL | Status: DC

## 2014-03-29 MED ORDER — SULFAMETHOXAZOLE-TMP DS 800-160 MG PO TABS
1.0000 | ORAL_TABLET | Freq: Once | ORAL | Status: AC
  Administered 2014-03-29: 1 via ORAL
  Filled 2014-03-29: qty 1

## 2014-03-29 NOTE — Discharge Instructions (Signed)
Keep your wound and infection clean and dry.  Follow up with the wound center tomorrow as discussed.  Return any time for changing or worsening symptoms.   Wound Infection A wound infection happens when a type of germ (bacteria) starts growing in the wound. In some cases, this can cause the wound to break open. If cared for properly, the infected wound will heal from the inside to the outside. Wound infections need treatment. CAUSES An infection is caused by bacteria growing in the wound.  SYMPTOMS   Increase in redness, swelling, or pain at the wound site.  Increase in drainage at the wound site.  Wound or bandage (dressing) starts to smell bad.  Fever.  Feeling tired or fatigued.  Pus draining from the wound. TREATMENT  Your health care provider will prescribe antibiotic medicine. The wound infection should improve within 24 to 48 hours. Any redness around the wound should stop spreading and the wound should be less painful.  HOME CARE INSTRUCTIONS   Only take over-the-counter or prescription medicines for pain, discomfort, or fever as directed by your health care provider.  Take your antibiotics as directed. Finish them even if you start to feel better.  Gently wash the area with mild soap and water 2 times a day, or as directed. Rinse off the soap. Pat the area dry with a clean towel. Do not rub the wound. This may cause bleeding.  Follow your health care provider's instructions for how often you need to change the dressing.  Apply ointment and a dressing to the wound as directed.  If the dressing sticks, moisten it with soapy water and gently remove it.  Change the bandage right away if it becomes wet, dirty, or develops a bad smell.  Take showers. Do not take tub baths, swim, or do anything that may soak the wound until it is healed.  Avoid exercises that make you sweat heavily.  Use anti-itch medicine as directed by your health care provider. The wound may itch when  it is healing. Do not pick or scratch at the wound.  Follow up with your health care provider to get your wound rechecked as directed. SEEK MEDICAL CARE IF:  You have an increase in swelling, pain, or redness around the wound.  You have an increase in the amount of pus coming from the wound.  There is a bad smell coming from the wound.  More of the wound breaks open.  You have a fever. MAKE SURE YOU:   Understand these instructions.  Will watch your condition.  Will get help right away if you are not doing well or get worse. Document Released: 06/07/2003 Document Revised: 09/13/2013 Document Reviewed: 01/12/2011 [facility] Patient Information 2015 [place]ExitCare, [place]LLC. This information is not intended to replace advice given to you by your health care provider. Make sure you discuss any questions you have with your health care provider.

## 2014-03-29 NOTE — ED Notes (Signed)
Patient transported to X-ray 

## 2014-03-29 NOTE — ED Notes (Signed)
Pt has a ride home.  

## 2014-03-31 LAB — WOUND CULTURE: GRAM STAIN: NONE SEEN

## 2014-04-01 NOTE — ED Provider Notes (Signed)
Medical screening examination/treatment/procedure(s) were conducted as a shared visit with non-physician practitioner(s) and myself.  I personally evaluated the patient during the encounter.   EKG Interpretation None      Pt seen and examined.  Pt states that he attempted to remove some "thick dead skin with one of those 'cheese grater things'".  Exam shows cellulitis. excellent job of debrieding non viable superficial tissue by PA.  Agree with DC on ABX and wound Center referral.  Rolland PorterMark Nikoloz, MD 04/01/14 0700

## 2014-04-04 ENCOUNTER — Encounter (HOSPITAL_BASED_OUTPATIENT_CLINIC_OR_DEPARTMENT_OTHER): Payer: No Typology Code available for payment source | Attending: General Surgery

## 2014-04-04 DIAGNOSIS — L918 Other hypertrophic disorders of the skin: Secondary | ICD-10-CM

## 2014-04-04 DIAGNOSIS — L908 Other atrophic disorders of skin: Secondary | ICD-10-CM | POA: Insufficient documentation

## 2014-04-04 DIAGNOSIS — L97509 Non-pressure chronic ulcer of other part of unspecified foot with unspecified severity: Secondary | ICD-10-CM | POA: Insufficient documentation

## 2014-04-05 NOTE — Progress Notes (Signed)
[facility]  NAME:  Rivka SpringSINCLAIR, Abner              ACCOUNT NO.:  [id number]  MEDICAL RECORD NO.:  [id number]      DATE OF BIRTH:  [birth year]  PHYSICIAN:  Ardath SaxPeter Cadan Maggart, M.D.           VISIT DATE:                                  OFFICE VISIT   Andre MarchJames Hirschhorn is a 43 year old, and he states that he stepped the wrong way and scraped the skin off the lateral aspect of his left foot, and he states that while he was trying to get some of the dead skin off he created an ulcer.  When I am looking at this wound now, he has got about of 4 or 5 mm ulcer and it is surrounded by skin that looks to me like a fungal dermatitis.  I debrided some of this hypertrophic skin away and cleaned out the ulcer and I am going to treat it with ketoconazole and have him put it on every day and see if this does not help.  We also sent some tissue for a fungal culture.  Otherwise, he is very healthy. His vital signs were totally normal when he came in except for a blood pressure was elevated to 149/89.  He felt like his workload on his feet all the time bothered him significantly and was prolonging the healing process.  So, I gave him a note to stay out of work for a couple of weeks.  So, his diagnosis is going to be a history of trauma to the lateral aspect of his left ankle and heel and I think secondary fungal infection is involved.     Ardath SaxPeter Terilynn Buresh, M.D.     PP/MEDQ  D:  04/04/2014  T:  04/05/2014  Job:  [id number]

## 2014-04-09 ENCOUNTER — Emergency Department (HOSPITAL_COMMUNITY)
Admission: EM | Admit: 2014-04-09 | Discharge: 2014-04-10 | Disposition: A | Discharge status: Home / Self Care | Payer: No Typology Code available for payment source | Attending: Emergency Medicine | Admitting: Emergency Medicine

## 2014-04-09 ENCOUNTER — Encounter (HOSPITAL_COMMUNITY): Payer: Self-pay | Admitting: Emergency Medicine

## 2014-04-09 DIAGNOSIS — G8929 Other chronic pain: Secondary | ICD-10-CM | POA: Insufficient documentation

## 2014-04-09 DIAGNOSIS — Z792 Long term (current) use of antibiotics: Secondary | ICD-10-CM | POA: Insufficient documentation

## 2014-04-09 DIAGNOSIS — M25579 Pain in unspecified ankle and joints of unspecified foot: Secondary | ICD-10-CM | POA: Insufficient documentation

## 2014-04-09 DIAGNOSIS — Z79899 Other long term (current) drug therapy: Secondary | ICD-10-CM | POA: Insufficient documentation

## 2014-04-09 DIAGNOSIS — M25572 Pain in left ankle and joints of left foot: Secondary | ICD-10-CM

## 2014-04-09 MED ORDER — OXYCODONE-ACETAMINOPHEN 5-325 MG PO TABS
2.0000 | ORAL_TABLET | Freq: Once | ORAL | Status: AC
  Administered 2014-04-10: 2 via ORAL
  Filled 2014-04-09: qty 2

## 2014-04-09 NOTE — ED Provider Notes (Signed)
CSN: [id number]     Arrival date & time 04/09/14  2331 History   First MD Initiated Contact with Patient 04/09/14 2338     Chief Complaint  Patient presents with  . Foot Pain   HPI  History provided by the patient. Patient is a 43 year old male who returns for reevaluation of foot swelling and infection. Patient was evaluated and treated for a wound infection to his left foot and ankle area. She reports having significant improvement in the swelling and the wound. Denies any return of bleeding or drainage. He does report running out of his pain medication and having continued pain to the area. Pain is worse with pressure and walking. He has used some rest and crutches at times but does walk on his foot. He is following up with the wound clinic who is also treating him for fungus on the feet. He has an appointment in one to 2 weeks for followup. Denies any associated fever, chills or sweats. No other aggravating or alleviating factors. No other associated symptoms.    History reviewed. No pertinent past medical history. History reviewed. No pertinent past surgical history. History reviewed. No pertinent family history. History  Substance Use Topics  . Smoking status: Never Smoker   . Smokeless tobacco: Not on file  . Alcohol Use: No    Review of Systems  Constitutional: Negative for fever, chills and diaphoresis.  Neurological: Negative for weakness and numbness.  All other systems reviewed and are negative.     Allergies  Review of patient's allergies indicates no known allergies.  Home Medications   Prior to Admission medications   Medication Sig Start Date End Date Taking? Authorizing Provider  cephALEXin (KEFLEX) 500 MG capsule Take 1 capsule (500 mg total) by mouth 4 (four) times daily. 03/29/14   Phill Mutter Constant [place], PA-C  ibuprofen (ADVIL,MOTRIN) 200 MG tablet Take 400 mg by mouth every 6 (six) hours as needed for mild pain.    Historical Provider, MD  Multiple Vitamin  (MULTIVITAMIN WITH MINERALS) TABS Take 1 tablet by mouth daily.    Historical Provider, MD  oxyCODONE-acetaminophen (PERCOCET) 10-325 MG per tablet Take 1 tablet by mouth every 4 (four) hours as needed for pain. 03/29/14   Angus Seller, PA-C  oxyCODONE-acetaminophen (PERCOCET) 5-325 MG per tablet Take 1-2 tablets by mouth every 6 (six) hours as needed. 03/24/14   Mercedes Strupp Camprubi-Soms, PA-C  sulfamethoxazole-trimethoprim (SEPTRA DS) 800-160 MG per tablet Take 1 tablet by mouth every 12 (twelve) hours. 03/29/14   Phill Mutter Benedetto Ryder, PA-C   BP 142/81  Pulse 102  Temp(Src) 98.9 F (37.2 C) (Oral)  SpO2 96% Physical Exam  Nursing note and vitals reviewed. Constitutional: He is oriented to person, place, and time. He appears well-developed and well-nourished.  HENT:  Head: Normocephalic.  Cardiovascular: Normal rate and regular rhythm.   Pulmonary/Chest: Effort normal and breath sounds normal.  Abdominal: Soft.  Musculoskeletal: He exhibits edema and tenderness.  Patient continues to have some mild to moderate swelling of the left ankle and foot area. There is significant improvement however from previous visit. He also has significant improvement to a wound to the medial aspect of the ankle and heel. Normal pulses and sensations.  Neurological: He is alert and oriented to person, place, and time.  Skin: Skin is warm.  Psychiatric: He has a normal mood and affect. His behavior is normal.          ED Course  Procedures   COORDINATION OF CARE:  Nursing notes reviewed. Vital signs reviewed. Initial pt interview and examination performed.   Filed Vitals:   04/09/14 2337  BP: 142/81  Pulse: 102  Temp: 98.9 F (37.2 C)  TempSrc: Oral  SpO2: 96%    11:51 PM-patient seen and evaluated. Patient's wound appears to be healing significantly better. There is reduced swelling as well. The patient still having tenderness within the ankle. There is no increased warmth or redness to the  skin. Do not suspect any returning infection at this time. Patient did have staph and strep from a wound culture. He was treated with Keflex and Septra which she took for the full length of time. He does have a follow up appointments at the wound clinic. At this time will give a short prescription for pain medicine and advised patient to continue to use RICE treatment.  Upon discharge patient became upset because he was only given a prescription for 5 mg to 25 oxycodone. He requested 10 mg tabs. I explained that with him the length of time of his injuries and infection that he needed to followup with primary care provider or orthopedic specialist for continued treatment. I told him that I do not feel that continued long-term use of narcotics was appropriate for his condition and that if he continued to have such pain requiring that strong of dose he should see a specialist. He was upset with this and thought that I was "judging him". At the same time patient's friend came into the room who had also checked into the emergency room for a chronic left lower extremity pain. They left together.   Treatment plan initiated: Medications  oxyCODONE-acetaminophen (PERCOCET/ROXICET) 5-325 MG per tablet 2 tablet (not administered)      MDM   Final diagnoses:  Ankle pain, chronic, left        Angus Sellereter S Yolando Gillum, [place] JerseyPA-C 04/10/14 [phone]

## 2014-04-09 NOTE — ED Notes (Addendum)
Pt states that he injured his foot approx. 2.5 weeks ago and is still having pain. Coming in for a refill of pain meds. Pt also has wound on L foot as well. Has seen wound clinic for this. Alert and oriented.

## 2014-04-10 MED ORDER — OXYCODONE-ACETAMINOPHEN 5-325 MG PO TABS: 1.0000 | ORAL_TABLET | Freq: Four times a day (QID) | ORAL | Status: DC | PRN

## 2014-04-10 MED ORDER — MELOXICAM 15 MG PO TABS: 15.0000 mg | ORAL_TABLET | Freq: Every day | ORAL | Status: DC

## 2014-04-10 NOTE — ED Notes (Signed)
Patient is alert and oriented x3.  He was given DC instructions and follow up visit instructions.  Patient gave verbal understanding.  He was DC ambulatory under his own power to home.  Unable to obtain V/S.  He was not showing any signs of distress on DC

## 2014-04-10 NOTE — Discharge Instructions (Signed)
Please continue to followup at the wound Center or with a primary care provider for continued evaluation and treatment. Continue to use rest, ice, compression and elevation to reduce pain and swelling in the foot.    Ankle Pain Ankle pain is a common symptom. The bones, cartilage, tendons, and muscles of the ankle joint perform a lot of work each day. The ankle joint holds your body weight and allows you to move around. Ankle pain can occur on either side or back of 1 or both ankles. Ankle pain may be sharp and burning or dull and aching. There may be tenderness, stiffness, redness, or warmth around the ankle. The pain occurs more often when a person walks or puts pressure on the ankle. CAUSES  There are many reasons ankle pain can develop. It is important to work with your caregiver to identify the cause since many conditions can impact the bones, cartilage, muscles, and tendons. Causes for ankle pain include:  Injury, including a break (fracture), sprain, or strain often due to a fall, sports, or a high-impact activity.  Swelling (inflammation) of a tendon (tendonitis).  Achilles tendon rupture.  Ankle instability after repeated sprains and strains.  Poor foot alignment.  Pressure on a nerve (tarsal tunnel syndrome).  Arthritis in the ankle or the lining of the ankle.  Crystal formation in the ankle (gout or pseudogout). DIAGNOSIS  A diagnosis is based on your medical history, your symptoms, results of your physical exam, and results of diagnostic tests. Diagnostic tests may include X-ray exams or a computerized magnetic scan (magnetic resonance imaging, MRI). TREATMENT  Treatment will depend on the cause of your ankle pain and may include:  Keeping pressure off the ankle and limiting activities.  Using crutches or other walking support (a cane or brace).  Using rest, ice, compression, and elevation.  Participating in physical therapy or home exercises.  Wearing shoe inserts or  special shoes.  Losing weight.  Taking medications to reduce pain or swelling or receiving an injection.  Undergoing surgery. HOME CARE INSTRUCTIONS   Only take over-the-counter or prescription medicines for pain, discomfort, or fever as directed by your caregiver.  Put ice on the injured area.  Put ice in a plastic bag.  Place a towel between your skin and the bag.  Leave the ice on for 15-20 minutes at a time, 03-04 times a day.  Keep your leg raised (elevated) when possible to lessen swelling.  Avoid activities that cause ankle pain.  Follow specific exercises as directed by your caregiver.  Record how often you have ankle pain, the location of the pain, and what it feels like. This information may be helpful to you and your caregiver.  Ask your caregiver about returning to work or sports and whether you should drive.  Follow up with your caregiver for further examination, therapy, or testing as directed. SEEK MEDICAL CARE IF:   Pain or swelling continues or worsens beyond 1 week.  You have an oral temperature above 102 F (38.9 C).  You are feeling unwell or have chills.  You are having an increasingly difficult time with walking.  You have loss of sensation or other new symptoms.  You have questions or concerns. MAKE SURE YOU:   Understand these instructions.  Will watch your condition.  Will get help right away if you are not doing well or get worse. Document Released: 02/26/2010 Document Revised: 12/01/2011 Document Reviewed: 02/26/2010 [facility] Patient Information 2015 [employer], [place]. This information is not intended  to replace advice given to you by your health care provider. Make sure you discuss any questions you have with your health care provider. ° °

## 2014-04-13 NOTE — ED Provider Notes (Signed)
Medical screening examination/treatment/procedure(s) were performed by non-physician practitioner and as supervising physician I was immediately available for consultation/collaboration.   EKG Interpretation None        Candyce ChurnJohn David Jaydn Moscato III, MD 04/13/14 [phone]

## 2014-04-25 ENCOUNTER — Encounter (HOSPITAL_BASED_OUTPATIENT_CLINIC_OR_DEPARTMENT_OTHER): Payer: No Typology Code available for payment source | Attending: General Surgery

## 2014-04-25 DIAGNOSIS — L97409 Non-pressure chronic ulcer of unspecified heel and midfoot with unspecified severity: Secondary | ICD-10-CM | POA: Insufficient documentation

## 2014-05-22 ENCOUNTER — Emergency Department (HOSPITAL_COMMUNITY)
Admission: EM | Admit: 2014-05-22 | Discharge: 2014-05-22 | Disposition: A | Discharge status: Home / Self Care | Payer: No Typology Code available for payment source | Attending: Emergency Medicine | Admitting: Emergency Medicine

## 2014-05-22 ENCOUNTER — Emergency Department (HOSPITAL_COMMUNITY): Payer: No Typology Code available for payment source

## 2014-05-22 ENCOUNTER — Encounter (HOSPITAL_COMMUNITY): Payer: Self-pay | Admitting: Emergency Medicine

## 2014-05-22 DIAGNOSIS — Z792 Long term (current) use of antibiotics: Secondary | ICD-10-CM | POA: Insufficient documentation

## 2014-05-22 DIAGNOSIS — Z79899 Other long term (current) drug therapy: Secondary | ICD-10-CM | POA: Insufficient documentation

## 2014-05-22 DIAGNOSIS — M25569 Pain in unspecified knee: Secondary | ICD-10-CM | POA: Insufficient documentation

## 2014-05-22 DIAGNOSIS — M25469 Effusion, unspecified knee: Secondary | ICD-10-CM | POA: Insufficient documentation

## 2014-05-22 DIAGNOSIS — Z791 Long term (current) use of non-steroidal anti-inflammatories (NSAID): Secondary | ICD-10-CM | POA: Insufficient documentation

## 2014-05-22 DIAGNOSIS — M25461 Effusion, right knee: Secondary | ICD-10-CM

## 2014-05-22 MED ORDER — OXYCODONE-ACETAMINOPHEN 5-325 MG PO TABS
2.0000 | ORAL_TABLET | Freq: Once | ORAL | Status: AC
  Administered 2014-05-22: 2 via ORAL
  Filled 2014-05-22: qty 2

## 2014-05-22 MED ORDER — OXYCODONE-ACETAMINOPHEN 5-325 MG PO TABS: 1.0000 | ORAL_TABLET | Freq: Once | ORAL | Status: DC

## 2014-05-22 MED ORDER — OXYCODONE-ACETAMINOPHEN 5-325 MG PO TABS: 1.0000 | ORAL_TABLET | ORAL | Status: DC | PRN

## 2014-05-22 NOTE — Discharge Instructions (Signed)
Knee Effusion ° Knee effusion means you have fluid in your knee. The knee may be more difficult to bend and move. °HOME CARE °· Use crutches or a brace as told by your doctor. °· Put ice on the injured area. °¨ Put ice in a plastic bag. °¨ Place a towel between your skin and the bag. °¨ Leave the ice on for 15-20 minutes, 03-04 times a day. °· Raise (elevate) your knee as much as possible. °· Only take medicine as told by your doctor. °· You may need to do strengthening exercises. Ask your doctor. °· Continue with your normal diet and activities as told by your doctor. °GET HELP RIGHT AWAY IF: °· You have more puffiness (swelling) in your knee. °· You see redness, puffiness, or have more pain in your knee. °· You have a temperature by mouth above 102° F (38.9° C). °· You get a rash. °· You have trouble breathing. °· You have a reaction to any medicine you are taking. °· You have a lot of pain when you move your knee. °MAKE SURE YOU: °· Understand these instructions. °· Will watch your condition. °· Will get help right away if you are not doing well or get worse. °Document Released: 10/11/2010 Document Revised: 12/01/2011 Document Reviewed: 10/11/2010 °ExitCare® Patient Information ©2015 [employer]. This information is not intended to replace advice given to you by your health care provider. Make sure you discuss any questions you have with your health care provider. ° °

## 2014-05-22 NOTE — ED Notes (Signed)
Pt reports yesterday was walking a lot and then reports his right knee gave out but he did not fall. Since then he has had pain and swelling to right knee. Pt is a x 4. Sensation intact

## 2014-05-22 NOTE — ED Provider Notes (Signed)
CSN: [id number]     Arrival date & time 05/22/14  1723 History  This chart was scribed for non-physician practitioner, Junious Silk, PA-C working with Gerhard Munch, MD by Greggory Stallion, ED scribe. This patient was seen in room TR05C/TR05C and the patient's care was started at 6:21 PM.    Chief Complaint  Patient presents with  . Knee Pain   The history is provided by the patient. No language interpreter was used.   HPI Comments: Andre Weiss is a 43 y.o. male who presents to the Emergency Department complaining of sudden onset right knee pain with associated swelling that started yesterday. States he was walking when his knee gave out on him. Reports hearing a pop. Denies any fall. Bearing weight and knee extension worsen the pain. Pt has iced and elevated with little relief. Denies history of knee problems. No recent steroids or fluoroquinolones.    History reviewed. No pertinent past medical history. History reviewed. No pertinent past surgical history. No family history on file. History  Substance Use Topics  . Smoking status: Never Smoker   . Smokeless tobacco: Not on file  . Alcohol Use: No    Review of Systems  Musculoskeletal: Positive for arthralgias and joint swelling.  All other systems reviewed and are negative.  Allergies  Review of patient's allergies indicates no known allergies.  Home Medications   Prior to Admission medications   Medication Sig Start Date End Date Taking? Authorizing Provider  cephALEXin (KEFLEX) 500 MG capsule Take 1 capsule (500 mg total) by mouth 4 (four) times daily. 03/29/14   Phill Mutter Dammen, PA-C  ibuprofen (ADVIL,MOTRIN) 200 MG tablet Take 400 mg by mouth every 6 (six) hours as needed for mild pain.    Historical Provider, MD  meloxicam (MOBIC) 15 MG tablet Take 1 tablet (15 mg total) by mouth daily. 04/10/14   Angus Seller, PA-C  Multiple Vitamin (MULTIVITAMIN WITH MINERALS) TABS Take 1 tablet by mouth daily.    Historical Provider,  MD  oxyCODONE-acetaminophen (PERCOCET) 10-325 MG per tablet Take 1 tablet by mouth every 4 (four) hours as needed for pain. 03/29/14   Angus Seller, PA-C  oxyCODONE-acetaminophen (PERCOCET) 5-325 MG per tablet Take 1-2 tablets by mouth every 6 (six) hours as needed. 03/24/14   Mercedes Strupp Camprubi-Soms, PA-C  oxyCODONE-acetaminophen (PERCOCET/ROXICET) 5-325 MG per tablet Take 1-2 tablets by mouth every 6 (six) hours as needed for severe pain. 04/10/14   Phill Mutter Dammen, PA-C  sulfamethoxazole-trimethoprim (SEPTRA DS) 800-160 MG per tablet Take 1 tablet by mouth every 12 (twelve) hours. 03/29/14   Phill Mutter Dammen, PA-C   BP 129/68  Pulse 97  Temp(Src) 99.9 F (37.7 C) (Oral)  Resp 14  SpO2 99%  Physical Exam  Nursing note and vitals reviewed. Constitutional: He is oriented to person, place, and time. He appears well-developed and well-nourished. No distress.  HENT:  Head: Normocephalic and atraumatic.  Right Ear: External ear normal.  Left Ear: External ear normal.  Nose: Nose normal.  Eyes: Conjunctivae are normal.  Neck: Normal range of motion. No tracheal deviation present.  Cardiovascular: Normal rate, regular rhythm and normal heart sounds.   Pulmonary/Chest: Effort normal and breath sounds normal. No stridor.  Abdominal: Soft. He exhibits no distension. There is no tenderness.  Musculoskeletal: Normal range of motion. He exhibits edema and tenderness.  Positive apprehension sign. Tender to palpation over anterior knee. Significant swelling to right knee. Unable to extend lower leg. Sensation intact. 2+ PT pulses.  Compartments soft.   Neurological: He is alert and oriented to person, place, and time.  Sensation intact  Skin: Skin is warm and dry. He is not diaphoretic.  Psychiatric: He has a normal mood and affect. His behavior is normal.    ED Course  Procedures (including critical care time)  DIAGNOSTIC STUDIES: Oxygen Saturation is 98% on RA, normal by my interpretation.     COORDINATION OF CARE: 6:25 PM-Discussed treatment plan which includes xray, crutches, knee immobilizer and pain medication with pt at bedside and pt agreed to plan.   Labs Review Labs Reviewed - No data to display  Imaging Review Dg Knee Complete 4 Views Right  05/22/2014   CLINICAL DATA:  Anterior and medial right knee pain the level of the patella for 2 days  EXAM: RIGHT KNEE - COMPLETE 4+ VIEW  COMPARISON:  None.  FINDINGS: Minimal narrowing and osteophyte formation of the medial compartment. Minimal narrowing of the patellofemoral compartment. Moderate joint effusion. No fracture or dislocation.  IMPRESSION: Nonspecific joint effusion.   Electronically Signed   By: Esperanza Heir M.D.   On: 05/22/2014 18:56     EKG Interpretation None      MDM   Final diagnoses:  Knee effusion, right    Patient presents to ED with right knee pain since yesterday when he felt his knee give out on him. Joint effusion to right knee. Neurovascularly intact. Compartment soft. Joint stable. I have concerns for tendon and/or ligamentous injury to knee. Patient was placed in knee immobilizer and given crutches. He was given orthopedic follow up. Discussed rest, ice, compression, elevation. Discussed reasons to return to the ED immediately. Vital signs stable for discharge.   I personally performed the services described in this documentation, which was scribed in my presence. The recorded information has been reviewed and is accurate.  Mora Bellman, PA-C 05/22/14 [phone]

## 2014-05-22 NOTE — ED Provider Notes (Signed)
  Medical screening examination/treatment/procedure(s) were performed by non-physician practitioner and as supervising physician I was immediately available for consultation/collaboration.   EKG Interpretation None         Naomy Esham, MD 05/22/14 2342 

## 2015-12-09 ENCOUNTER — Encounter (HOSPITAL_COMMUNITY): Payer: Self-pay | Admitting: Emergency Medicine

## 2015-12-09 ENCOUNTER — Emergency Department (HOSPITAL_COMMUNITY)
Admission: EM | Admit: 2015-12-09 | Discharge: 2015-12-09 | Disposition: A | Discharge status: Home / Self Care | Payer: No Typology Code available for payment source | Attending: Emergency Medicine | Admitting: Emergency Medicine

## 2015-12-09 DIAGNOSIS — H5712 Ocular pain, left eye: Secondary | ICD-10-CM

## 2015-12-09 DIAGNOSIS — H5789 Other specified disorders of eye and adnexa: Secondary | ICD-10-CM

## 2015-12-09 DIAGNOSIS — H578 Other specified disorders of eye and adnexa: Secondary | ICD-10-CM | POA: Insufficient documentation

## 2015-12-09 MED ORDER — ERYTHROMYCIN 5 MG/GM OP OINT: TOPICAL_OINTMENT | OPHTHALMIC | Status: DC

## 2015-12-09 MED ORDER — PROPARACAINE HCL 0.5 % OP SOLN
1.0000 [drp] | Freq: Once | OPHTHALMIC | Status: AC
  Administered 2015-12-09: 1 [drp] via OPHTHALMIC
  Filled 2015-12-09: qty 15

## 2015-12-09 MED ORDER — FLUORESCEIN SODIUM 1 MG OP STRP
1.0000 | ORAL_STRIP | Freq: Once | OPHTHALMIC | Status: AC
  Administered 2015-12-09: 1 via OPHTHALMIC
  Filled 2015-12-09: qty 1

## 2015-12-09 NOTE — ED Notes (Signed)
L eye flushed.

## 2015-12-09 NOTE — ED Notes (Signed)
Pt states since yesterday left eye redness, irritation. Conjunctiva appears red and irritated, no complaints of itching or drainage. States he's been using drops to manage the discomfort. Also states he could've gotten something in his eye while at work.

## 2015-12-09 NOTE — ED Provider Notes (Signed)
CSN: [id number]     Arrival date & time 12/09/15  1112 History  By signing my name below, I, Evon Slackerrance Branch, attest that this documentation has been prepared under the direction and in the presence of Bear StearnsKayla Markos Theil, PA-C. Electronically Signed: Evon Slackerrance Branch, ED Scribe. 12/09/2015. 1:27 PM.     Chief Complaint  Patient presents with  . Eye Problem   Patient is a 45 y.o. male presenting with eye problem. The history is provided by the patient. No language interpreter was used.  Eye Problem Associated symptoms: redness   Associated symptoms: no nausea and no vomiting    HPI Comments: Iona HansenJames E Gladwell is a 45 y.o. male who presents to the Emergency Department complaining of worsening left eye redness and irritation onset 1 day prior. Pt reports eye pain. Pt states that he feels like something may be in his eye. Pt denies anything falling into the eye. Pt states that he has been putting eye drops and tried to rinse the eye with no relief. Pt denies wearing contacts. Pt denies injury or trauma to the eye. He states that the pain is worse with bright lights. Pt denies fever, chills, headache, rhinorrhea, sore throat, nausea, vomiting or vision changes   History reviewed. No pertinent past medical history. History reviewed. No pertinent past surgical history. History reviewed. No pertinent family history. Social History  Substance Use Topics  . Smoking status: Never Smoker   . Smokeless tobacco: None  . Alcohol Use: No    Review of Systems  Constitutional: Negative for fever and chills.  HENT: Negative for rhinorrhea and sore throat.   Eyes: Positive for pain and redness. Negative for visual disturbance.  Gastrointestinal: Negative for nausea and vomiting.  All other systems reviewed and are negative.     Allergies  Review of patient's allergies indicates no known allergies.  Home Medications   Prior to Admission medications   Medication Sig Start Date End Date Taking? Authorizing  Provider  naphazoline-pheniramine (NAPHCON-A) 0.025-0.3 % ophthalmic solution 1 drop 4 (four) times daily as needed for irritation.   Yes Historical Provider, MD   BP 141/93 mmHg  Pulse 102  Temp(Src) 98.7 F (37.1 C) (Oral)  Resp 18  SpO2 98%   Physical Exam  Constitutional: He is oriented to person, place, and time. He appears well-developed and well-nourished.  HENT:  Head: Normocephalic and atraumatic.  Right Ear: External ear normal.  Left Ear: External ear normal.  Eyes: EOM and lids are normal. Pupils are equal, round, and reactive to light. Lids are everted and swept, no foreign bodies found. Right eye exhibits no chemosis, no discharge, no exudate and no hordeolum. No foreign body present in the right eye. Left eye exhibits no chemosis, no discharge, no exudate and no hordeolum. No foreign body present in the left eye. Right conjunctiva is not injected. Right conjunctiva has no hemorrhage. Left conjunctiva is injected. Left conjunctiva has no hemorrhage. No scleral icterus.  Slit lamp exam:      The right eye shows no fluorescein uptake.       The left eye shows no corneal abrasion, no corneal flare, no foreign body, no hyphema and no hypopyon.  No proptosis.  Visual Acuity Right Eye Distance: 20/40 Left Eye Distance: 20/30 Bilateral Distance: 20/25     Neck: No tracheal deviation present.  Pulmonary/Chest: Effort normal. No respiratory distress.  Abdominal: He exhibits no distension.  Musculoskeletal: Normal range of motion.  Neurological: He is alert and oriented to person,  place, and time.  Skin: Skin is warm and dry.  Psychiatric: He has a normal mood and affect. His behavior is normal.    ED Course  Procedures (including critical care time) DIAGNOSTIC STUDIES: Oxygen Saturation is 98% on RA, normal by my interpretation.    COORDINATION OF CARE: 1:27 PM-Discussed treatment plan with pt at bedside and pt agreed to plan.     Labs Review Labs Reviewed - No  data to display  Imaging Review No results found.    EKG Interpretation None      MDM   Final diagnoses:  Left eye pain  Irritation of left eye   Patient presents with left eye irritation and pain.  No trauma.  No visual changes.  No headache, fever, chills, eye swelling.  No contact use.  On exam, PERRL, EOMs normal, no FB, corneal abrasion, no fluorescein uptake.  No proptosis. No periorbital erythema or edema.  Doubt periorbital cellulitis.  Eye thoroughly flushed and patient reports improvement of symptoms. Plan to discharge home with erythromycin ointment for possible infection.  Follow up opthalmology in 2 days.  Discussed return precautions.  Patient agrees and acknowledges the above plan for discharge.  I personally performed the services described in this documentation, which was scribed in my presence. The recorded information has been reviewed and is accurate.     Cheri Fowler, PA-C 12/09/15 1423  Alvira Monday, MD 12/09/15 2352

## 2015-12-09 NOTE — Discharge Instructions (Signed)

## 2015-12-22 ENCOUNTER — Encounter (HOSPITAL_COMMUNITY): Payer: Self-pay

## 2015-12-22 ENCOUNTER — Emergency Department (HOSPITAL_COMMUNITY)
Admission: EM | Admit: 2015-12-22 | Discharge: 2015-12-22 | Disposition: A | Discharge status: Home / Self Care | Payer: No Typology Code available for payment source | Attending: Emergency Medicine | Admitting: Emergency Medicine

## 2015-12-22 DIAGNOSIS — B309 Viral conjunctivitis, unspecified: Secondary | ICD-10-CM

## 2015-12-22 MED ORDER — FLUORESCEIN SODIUM 1 MG OP STRP
1.0000 | ORAL_STRIP | Freq: Once | OPHTHALMIC | Status: AC
  Administered 2015-12-22: 1 via OPHTHALMIC
  Filled 2015-12-22: qty 1

## 2015-12-22 MED ORDER — TETRACAINE HCL 0.5 % OP SOLN
OPHTHALMIC | Status: AC
  Administered 2015-12-22: 07:00:00
  Filled 2015-12-22: qty 4

## 2015-12-22 NOTE — Discharge Instructions (Signed)
Her symptoms are likely due to a virus and should resolve on their own over the next week. He may continue using your eye drops for comfort. He may also use warm compresses. Follow-up with your doctor for a recheck next week. He may also follow-up with Dr. Sherryll BurgerShah, ophthalmology at the end of next week if your symptoms worsen or do not improve  Viral Conjunctivitis Viral conjunctivitis is an inflammation of the clear membrane that covers the white part of your eye and the inner surface of your eyelid (conjunctiva). The inflammation is caused by a viral infection. The blood vessels in the conjunctiva become inflamed, causing the eye to become red or pink, and often itchy. Viral conjunctivitis can easily be passed from one person to another (contagious). CAUSES  Viral conjunctivitis is caused by a virus. A virus is a type of contagious germ. It can be spread by touching objects that have been contaminated with the virus, such as doorknobs or towels.  SYMPTOMS  Symptoms of viral conjunctivitis may include:   Eye redness.  Tearing or watery eyes.  Itchy eyes.  Burning feeling in the eyes.  Clear drainage from the eye.  Swollen eyelids.  A gritty feeling in the eye.  Light sensitivity. DIAGNOSIS  Viral conjunctivitis may be diagnosed with a medical history and physical exam. If you have discharge from your eye, the discharge may be tested to rule out other causes of conjunctivitis.  TREATMENT  Viral conjunctivitis does not respond to medicines that kill bacteria (antibiotics). Treatment for viral conjunctivitis is directed at stopping a bacterial infection from developing in addition to the viral infection. Treatment also aims to relieve your symptoms, such as itching. This may be done with antihistamine drops or other eye medicines. HOME CARE INSTRUCTIONS  Take medicines only as directed by your health care provider.  Avoid touching or rubbing your eyes.  Apply a warm, clean washcloth to  your eye for 10-20 minutes, 3-4 times per day.  If you wear contact lenses, do not wear them until the inflammation is gone and your health care provider says it is safe to wear them again. Ask your health care provider how to sterilize or replace your contact lenses before using them again. Wear glasses until you can resume wearing contacts.  Avoid wearing eye makeup until the inflammation is gone. Throw away any old eye cosmetics that may be contaminated.  Change or wash your pillowcase every day.  Do not share towels or washcloths. This may spread the infection.  Wash your hands often with soap and water. Use paper towels to dry your hands.  Gently wipe away any drainage from your eye with a warm, wet washcloth or a cotton ball.  Be very careful to avoid touching the edge of the eyelid with the eye drop bottle or ointment tube when applying medicines to the affected eye. This will stop you from spreading the infection to the other eye or to other people. SEEK MEDICAL CARE IF:   Your symptoms do not improve with treatment.  You have increased pain.  Your vision becomes blurry.  You have a fever.  You have facial pain, redness, or swelling.  You have new symptoms.  Your symptoms get worse.   This information is not intended to replace advice given to you by your health care provider. Make sure you discuss any questions you have with your health care provider.   Document Released: 11/29/2002 Document Revised: 03/02/2006 Document Reviewed: 06/20/2014 Elsevier Interactive Patient Education  2016 Elsevier Inc. ° °

## 2015-12-22 NOTE — ED Notes (Signed)
Visual acuity completed, left eye 20/60 , right eye at 20/50 , both eyes ;20/50.

## 2015-12-22 NOTE — ED Provider Notes (Signed)
CSN: [id number]     Arrival date & time 12/22/15  0516 History   First MD Initiated Contact with Patient 12/22/15 0602     Chief Complaint  Patient presents with  . Eye Problem     (Consider location/radiation/quality/duration/timing/severity/associated sxs/prior Treatment) HPI Andre Weiss is a 45 y.o. male here for evaluation of eye redness. Patient reports that the beginning of this week he noticed that his left eye was turning red, was slightly itchy and it was "tired" and difficulty keeping his eyes open. He reports the sensation is on his upper lid. He is unsure if he got something in his eye at work and would like to be checked. He was seen in this emergency department last week for this problem, discharged with erythromycin. He feels the erythromycin may have been helpful. Denies any other discharge, headache, vision changes, numbness or weakness, eye pain.   History reviewed. No pertinent past medical history. History reviewed. No pertinent past surgical history. History reviewed. No pertinent family history. Social History  Substance Use Topics  . Smoking status: Never Smoker   . Smokeless tobacco: None  . Alcohol Use: No    Review of Systems A 10 point review of systems was completed and was negative except for pertinent positives and negatives as mentioned in the history of present illness     Allergies  Review of patient's allergies indicates no known allergies.  Home Medications   Prior to Admission medications   Medication Sig Start Date End Date Taking? Authorizing Provider  erythromycin ophthalmic ointment Place a 1/2 inch ribbon of ointment into the lower eyelid 4 times a day for 5-7 days. 12/09/15  Yes Kayla Rose, PA-C  naphazoline-pheniramine (NAPHCON-A) 0.025-0.3 % ophthalmic solution 1 drop 4 (four) times daily as needed for irritation.   Yes Historical Provider, MD   BP 130/102 mmHg  Pulse 96  Temp(Src) 98.4 F (36.9 C) (Oral)  Resp 20  SpO2  98% Physical Exam  Constitutional:  Awake, alert, nontoxic appearance.  HENT:  Head: Atraumatic.  Eyes: Right eye exhibits no discharge. Left eye exhibits no discharge.  Pupils are equal, round and reactive to light. No consensual light pain. Left eye conjunctiva is injected. Clear tearing, mild. No fluorescein uptake. No abrasion. Extraocular movements intact without discomfort. Lid eversion without any obvious abnormalities.  Neck: Neck supple.  Pulmonary/Chest: Effort normal. He exhibits no tenderness.  Abdominal: Soft. There is no tenderness. There is no rebound.  Musculoskeletal: He exhibits no tenderness.  Baseline ROM, no obvious new focal weakness.  Neurological:  Mental status and motor strength appears baseline for patient and situation.  Skin: No rash noted.  Psychiatric: He has a normal mood and affect.  Nursing note and vitals reviewed.   ED Course  Procedures (including critical care time) Labs Review Labs Reviewed - No data to display  Imaging Review No results found. I have personally reviewed and evaluated these images and lab results as part of my medical decision-making.   EKG Interpretation None     Filed Vitals:   12/22/15 0532  BP: 130/102  Pulse: 96  Temp: 98.4 F (36.9 C)  TempSrc: Oral  Resp: 20  SpO2: 98%      Visual Acuity  Right Eye Distance:   Left Eye Distance:   Bilateral Distance:    Right Eye Near:   Left Eye Near:    Bilateral Near:     MDM  Viral conjunctivitis  Patient presentation consistent with viral conjunctivitis.  No purulent discharge, corneal abrasions, entrapment, consensual photophobia, or dendritic staining with fluorescein study.  Presentation non-concerning for iritis, bacterial conjunctivitis, corneal abrasions, or HSV. Nursing reports visual acuity is unchanged from prior exam. I do not feel antibiotics are indicated at this time.  Personal hygiene and frequent handwashing discussed.  Patient advised to  followup with ophthalmologist if symptoms persist or worsen in any way including vision change or purulent discharge.  Patient verbalizes understanding and is agreeable with discharge.  Final diagnoses:  Acute viral conjunctivitis of left eye      Joycie Peek, PA-C 12/22/15 1610  Jerelyn Scott, MD 12/22/15 2300

## 2015-12-22 NOTE — ED Notes (Signed)
Pt complains of eye irritation, he was seen earlier in the week and it's not any better

## 2016-03-02 ENCOUNTER — Emergency Department (HOSPITAL_COMMUNITY): Payer: Self-pay

## 2016-03-02 ENCOUNTER — Encounter (HOSPITAL_COMMUNITY): Payer: Self-pay | Admitting: Emergency Medicine

## 2016-03-02 ENCOUNTER — Emergency Department (HOSPITAL_COMMUNITY)
Admission: EM | Admit: 2016-03-02 | Discharge: 2016-03-02 | Disposition: A | Discharge status: Home / Self Care | Payer: Self-pay | Attending: Emergency Medicine | Admitting: Emergency Medicine

## 2016-03-02 DIAGNOSIS — Y929 Unspecified place or not applicable: Secondary | ICD-10-CM | POA: Insufficient documentation

## 2016-03-02 DIAGNOSIS — X501XXA Overexertion from prolonged static or awkward postures, initial encounter: Secondary | ICD-10-CM | POA: Insufficient documentation

## 2016-03-02 DIAGNOSIS — Y939 Activity, unspecified: Secondary | ICD-10-CM | POA: Insufficient documentation

## 2016-03-02 DIAGNOSIS — M25461 Effusion, right knee: Secondary | ICD-10-CM

## 2016-03-02 DIAGNOSIS — Y999 Unspecified external cause status: Secondary | ICD-10-CM | POA: Insufficient documentation

## 2016-03-02 DIAGNOSIS — S8391XA Sprain of unspecified site of right knee, initial encounter: Secondary | ICD-10-CM | POA: Insufficient documentation

## 2016-03-02 MED ORDER — HYDROCODONE-ACETAMINOPHEN 5-325 MG PO TABS
1.0000 | ORAL_TABLET | Freq: Once | ORAL | Status: AC
  Administered 2016-03-02: 1 via ORAL
  Filled 2016-03-02: qty 1

## 2016-03-02 MED ORDER — NAPROXEN 500 MG PO TABS: 500.0000 mg | ORAL_TABLET | Freq: Two times a day (BID) | ORAL | Status: DC | PRN

## 2016-03-02 MED ORDER — HYDROCODONE-ACETAMINOPHEN 5-325 MG PO TABS: 1.0000 | ORAL_TABLET | Freq: Four times a day (QID) | ORAL | Status: DC | PRN

## 2016-03-02 NOTE — ED Notes (Signed)
Pt returned from xray

## 2016-03-02 NOTE — ED Notes (Signed)
Patient transported to CT 

## 2016-03-02 NOTE — Discharge Instructions (Signed)
Wear knee immobilizer for at least 2 weeks for stabilization of knee, and at all times until you see the orthopedist. Use crutches for all weight bearing activities. Ice and elevate knee throughout the day. Alternate between naprosyn and norco for pain relief. Do not drive or operate machinery with pain medication use. Call orthopedic follow up today or tomorrow to schedule followup appointment in 1 week for recheck of ongoing knee pain and for ongoing management of your knee pain. Return to the ER for changes or worsening symptoms.    Knee Sprain A knee sprain is a tear in the strong bands of tissue that connect the bones (ligaments) of your knee. HOME CARE  Raise (elevate) your injured knee to lessen puffiness (swelling).  To ease pain and puffiness, put ice on the injured area.  Put ice in a plastic bag.  Place a towel between your skin and the bag.  Leave the ice on for 20 minutes, 2-3 times a day.  Only take medicine as told by your doctor.  Do not leave your knee unprotected until pain and stiffness go away (usually 4-6 weeks).  If you have a cast or splint, do not get it wet. If your doctor told you to not take it off, cover it with a plastic bag when you shower or bathe. Do not swim.  Your doctor may have you do exercises to prevent or limit permanent weakness and stiffness. GET HELP RIGHT AWAY IF:   Your cast or splint becomes damaged.  Your pain gets worse.  You have a lot of pain, puffiness, or numbness below the cast or splint. MAKE SURE YOU:   Understand these instructions.  Will watch your condition.  Will get help right away if you are not doing well or get worse.   This information is not intended to replace advice given to you by your health care provider. Make sure you discuss any questions you have with your health care provider.   Document Released: 08/27/2009 Document Revised: 09/13/2013 Document Reviewed: 05/17/2013 Elsevier Interactive Patient  Education 2016 Elsevier Inc.  Cryotherapy Cryotherapy is when you put ice on your injury. Ice helps lessen pain and puffiness (swelling) after an injury. Ice works the best when you start using it in the first 24 to 48 hours after an injury. HOME CARE  Put a dry or damp towel between the ice pack and your skin.  You may press gently on the ice pack.  Leave the ice on for no more than 10 to 20 minutes at a time.  Check your skin after 5 minutes to make sure your skin is okay.  Rest at least 20 minutes between ice pack uses.  Stop using ice when your skin loses feeling (numbness).  Do not use ice on someone who cannot tell you when it hurts. This includes small children and people with memory problems (dementia). GET HELP RIGHT AWAY IF:  You have white spots on your skin.  Your skin turns blue or pale.  Your skin feels waxy or hard.  Your puffiness gets worse. MAKE SURE YOU:   Understand these instructions.  Will watch your condition.  Will get help right away if you are not doing well or get worse.   This information is not intended to replace advice given to you by your health care provider. Make sure you discuss any questions you have with your health care provider.   Document Released: 02/25/2008 Document Revised: 12/01/2011 Document Reviewed: 05/01/2011 Elsevier Interactive  Patient Education 2016 [employer].  How to Use a Knee Brace A knee brace is a device that you wear to support your knee, especially if the knee is healing after an injury or surgery. There are several types of knee braces. Some are designed to prevent an injury (prophylactic brace). These are often worn during sports. Others support an injured knee (functional brace) or keep it still while it heals (rehabilitative brace). People with severe arthritis of the knee may benefit from a brace that takes some pressure off the knee (unloader brace). Most knee braces are made from a combination of cloth and  metal or plastic.  You may need to wear a knee brace to:  Relieve knee pain.  Help your knee support your weight (improve stability).  Help you walk farther (improve mobility).  Prevent injury.  Support your knee while it heals from surgery or from an injury. RISKS AND COMPLICATIONS Generally, knee braces are very safe to wear. However, problems may occur, including:  Skin irritation that may lead to infection.  Making your condition worse if you wear the brace in the wrong way. HOW TO USE A KNEE BRACE Different braces will have different instructions for use. Your health care provider will tell you or show you:  How to put on your brace.  How to adjust the brace.  When and how often to wear the brace.  How to remove the brace.  If you will need any assistive devices in addition to the brace, such as crutches or a cane. In general, your brace should:  Have the hinge of the brace line up with the bend of your knee.  Have straps, hooks, or tapes that fasten snugly around your leg.  Not feel too tight or too loose. HOW TO CARE FOR A KNEE BRACE  Check your brace often for signs of damage, such as loose connections or attachments. Your knee brace may get damaged or wear out during normal use.  Wash the fabric parts of your brace with soap and water.  Read the insert that comes with your brace for other specific care instructions. SEEK MEDICAL CARE IF:  Your knee brace is too loose or too tight and you cannot adjust it.  Your knee brace causes skin redness, swelling, bruising, or irritation.  Your knee brace is not helping.  Your knee brace is making your knee pain worse.   This information is not intended to replace advice given to you by your health care provider. Make sure you discuss any questions you have with your health care provider.   Document Released: 11/29/2003 Document Revised: 05/30/2015 Document Reviewed: 01/01/2015 Elsevier Interactive Patient  Education [employer]

## 2016-03-02 NOTE — ED Notes (Signed)
Pt states he stepped in to a hole last night and twisted his R knee and now has increased pain since this morning. Alert and oriented.

## 2016-03-02 NOTE — ED Notes (Signed)
Patient transported to X-ray 

## 2016-03-02 NOTE — ED Provider Notes (Signed)
CSN: [id number]     Arrival date & time 03/02/16  1753 History  By signing my name below, I, Phillis Haggis, attest that this documentation has been prepared under the direction and in the presence of [id number], [employer]. Electronically Signed: Phillis Haggis, ED Scribe. 03/02/2016. 6:23 PM.   Chief Complaint  Patient presents with  . Knee Injury   Patient is a 45 y.o. male presenting with knee pain. The history is provided by the patient. No language interpreter was used.  Knee Pain Location:  Knee Time since incident:  1 day Injury: yes   Mechanism of injury comment:  Stepped into a hole and twisted his knee Knee location:  R knee Pain details:    Quality:  Throbbing   Radiates to:  Does not radiate   Severity:  Severe   Onset quality:  Sudden   Duration:  1 day   Timing:  Constant   Progression:  Worsening Chronicity:  New Prior injury to area:  Yes Relieved by:  Heat and ice Worsened by:  Bearing weight and flexion Ineffective treatments:  None tried Associated symptoms: decreased ROM and swelling   Associated symptoms: no fever, no muscle weakness, no numbness and no tingling     HPI Comments: Andre Weiss is a 45 y.o. male who presents to the Emergency Department complaining of sudden onset gradually worsening right knee pain and swelling onset one day ago. He states that he twisted his knee after stepping in a hole last night. Pt reports pain is 10/10, constant, throbbing, nonradiating pain, worse with weightbearing and bending the knee. He reports slight improvement with ice and heat on the area. He has also taken an oral pain medication but does not remember the name. He reports hx of injury to the same knee once before. He denies warmth, erythema, abrasions, bruising, fevers, chills, CP, SOB, abd pain, N/V/D/C, hematuria, dysuria, myalgias, other bony injuries, other extremity pain, numbness, tingling, weakness, or any other injuries. He denies allergies to  medications.   History reviewed. No pertinent past medical history. History reviewed. No pertinent past surgical history. History reviewed. No pertinent family history. Social History  Substance Use Topics  . Smoking status: Never Smoker   . Smokeless tobacco: None  . Alcohol Use: No    Review of Systems  Constitutional: Negative for fever and chills.  Respiratory: Negative for shortness of breath.   Cardiovascular: Negative for chest pain.  Gastrointestinal: Negative for nausea, vomiting, abdominal pain, diarrhea and constipation.  Genitourinary: Negative for dysuria and hematuria.  Musculoskeletal: Positive for joint swelling and arthralgias (R knee). Negative for myalgias.  Skin: Negative for rash.  Allergic/Immunologic: Negative for immunocompromised state.  Neurological: Negative for weakness and numbness.  Psychiatric/Behavioral: Negative for confusion.   10 Systems reviewed and all are negative for acute change except as noted in the HPI.  Allergies  Review of patient's allergies indicates no known allergies.  Home Medications   Prior to Admission medications   Medication Sig Start Date End Date Taking? Authorizing Provider  erythromycin ophthalmic ointment Place a 1/2 inch ribbon of ointment into the lower eyelid 4 times a day for 5-7 days. 12/09/15   Kayla Rose, PA-C  naphazoline-pheniramine (NAPHCON-A) 0.025-0.3 % ophthalmic solution 1 drop 4 (four) times daily as needed for irritation.    Historical Provider, MD   BP 150/96 mmHg  Pulse 102  Resp 20  SpO2 99% Physical Exam  Constitutional: He is oriented to person, place, and time. Vital signs  are normal. He appears well-developed and well-nourished.  Non-toxic appearance. No distress.  Nontoxic, NAD  HENT:  Head: Normocephalic and atraumatic.  Mouth/Throat: Mucous membranes are normal.  Eyes: Conjunctivae and EOM are normal. Right eye exhibits no discharge. Left eye exhibits no discharge.  Neck: Normal range  of motion. Neck supple.  Cardiovascular: Normal rate and intact distal pulses.   Pulmonary/Chest: Effort normal. No respiratory distress.  Abdominal: Normal appearance. He exhibits no distension.  Musculoskeletal:       Right knee: He exhibits decreased range of motion (due to pain), swelling and effusion. He exhibits no ecchymosis, no deformity, no laceration, no erythema, no LCL laxity and no MCL laxity. Tenderness found. Medial joint line and lateral joint line tenderness noted.  Right knee with limited ROM due to pain which severely limits ability to examine the joint fully, with exquisite tenderness to the medial and lateral joint lines, no tenderness to the lower leg/calf, +swelling and effusion, no deformity, no bruising or erythema, no warmth, no abnormal alignment, no varus/valgus laxity, neg anterior drawer test, no crepitus. Unable to assess patellar mobility due to pain/uncooperativeness, doesn't cooperate with quadriceps activation testing due to pain in knee. Strength and sensation grossly intact, distal pulses intact, compartments soft  Neurological: He is alert and oriented to person, place, and time. He has normal strength. No sensory deficit.  Skin: Skin is warm, dry and intact. No rash noted.  Psychiatric: He has a normal mood and affect.  Nursing note and vitals reviewed.   ED Course  Procedures (including critical care time) DIAGNOSTIC STUDIES: Oxygen Saturation is 99% on RA, normal by my interpretation.    COORDINATION OF CARE: 6:17 PM-Discussed treatment plan which includes x-ray with pt at bedside and pt agreed to plan.    Labs Review Labs Reviewed - No data to display  Imaging Review Ct Knee Right Wo Contrast  03/02/2016  CLINICAL DATA:  Right knee pain and swelling after twisting injury, query radiographically occult fracture. Knee effusion. EXAM: CT OF THE right  KNEE WITHOUT CONTRAST TECHNIQUE: Multidetector CT imaging of the right knee was performed according  to the standard protocol. Multiplanar CT image reconstructions were also generated. 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 This is thought to be chronic. Large knee effusion, as shown on recent radiography and back on radiography of 05/22/2014. There is also a moderate to large size Baker's cyst. There is osteoarthritis with medial compartmental narrowing and subcortical sclerosis in the medial compartment, as well as scattered marginal spurring along the articular spaces. A non-fragmented osteochondral lesion of the medial femoral trochlear groove measures 5 mm in diameter on image 39 series 4. I do not perceive a tibial plateau fracture. IMPRESSION: 1. Large knee effusion a moderate to large Baker's cyst, but I do not see an acute fracture. 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 2. The patient also had a large joint effusion when image 2 back on 05/22/2014. 3. If clinically warranted, MRI could be useful as an adjunct to assess for internal derangement or bone bruising/contusion not visible on CT. 4. Compartmental narrowing and mild subcortical sclerosis in the medial compartment compatible with osteoarthritis. Electronically Signed   By: Annitta NeedsWalter  Liebkemann M.D.  On: 03/02/2016 20:01   Dg Knee Complete 4 Views Right  03/02/2016  CLINICAL DATA:  Twisted right knee, pain EXAM: RIGHT KNEE - COMPLETE 4+ VIEW COMPARISON:  None. FINDINGS: No fracture or dislocation is seen. Very mild degenerative changes with sharpening of the tibial spines and tiny patellofemoral osteophytes. The visualized soft tissues are unremarkable. Small suprapatellar knee joint effusion.  IMPRESSION: No fracture or dislocation is seen. Very mild degenerative changes with small suprapatellar knee joint effusion. Electronically Signed   By: Charline Bills M.D.   On: 03/02/2016 18:50   I have personally reviewed and evaluated these images and lab results as part of my medical decision-making.   EKG Interpretation None      MDM   Final diagnoses:  Knee sprain, right, initial encounter  Knee effusion, right    45 y.o. male here with R knee pain since stepping into a hole and twisting his knee. NVI with soft compartments. Exam limited due to pt's pain with ROM of the knee, able to flex knee slightly but he doesn't allow much movement of the knee due to pain. Unable to assess patellar mobility to see if there is a patellar injury-- he doesn't cooperate with quad activation either. +Swelling/effusion, +jointline tenderness diffusely throughout the knee, no warmth/erythema/bruising. Doubt septic joint, skin intact. Will obtain xray imaging, may need to consider CT if we cannot see the tibial plateau well enough in the plain films. Will give pain medication and reassess after imaging  7:01 PM Xray without obvious fx/dislocation, some degenerative changes and suprapatellar joint effusion noted. Given the significant pain he's having, I want to ensure we don't miss a tibial plateau fx, will proceed with CT imaging to fully evaluate for this. Pt updated and denies any current complaints or questions. Will reassess after CT imaging  8:42 PM CT showing no tibial plateau fx, large effusion present with some old prior patellar injury noted, effusion had been seen in prior imaging as well. Some arthritis noted. Could be ligamentous vs tendon injury, will treat conservatively since I can't assess the patellar tendon adequately, will immobilize and give crutches. RICE discussed. Pain meds given. F/up with ortho in 1 wk for recheck and ongoing management. I explained the diagnosis and have given  explicit precautions to return to the ER including for any other new or worsening symptoms. The patient understands and accepts the medical plan as it's been dictated and I have answered their questions. Discharge instructions concerning home care and prescriptions have been given. The patient is STABLE and is discharged to home in good condition.   I personally performed the services described in this documentation, which was scribed in my presence. The recorded information has been reviewed and is accurate.  BP 150/96 mmHg  Pulse 102  Temp(Src) 99.9 F (37.7 C) (Oral)  Resp 20  SpO2 99%  Meds ordered this encounter  Medications  . HYDROcodone-acetaminophen (NORCO/VICODIN) 5-325 MG per tablet 1 tablet    Sig:   . naproxen (NAPROSYN) 500 MG tablet    Sig: Take 1 tablet (500 mg total) by mouth 2 (two) times daily as needed for mild pain, moderate pain or headache (TAKE WITH MEALS.).    Dispense:  20 tablet    Refill:  0    Order Specific Question:  Supervising Provider    Answer:  MILLER, BRIAN [[id number]]  . HYDROcodone-acetaminophen (NORCO) 5-325 MG tablet    Sig: Take 1 tablet by mouth every 6 (six) hours as needed for severe  pain.    Dispense:  10 tablet    Refill:  0    Order Specific Question:  Supervising Provider    Answer:  Eber Hong [[id number]]      Sherral Dirocco Camprubi-Soms, PA-C 03/02/16 2043  Loren Racer, MD 03/02/16 2321

## 2016-03-31 ENCOUNTER — Emergency Department (HOSPITAL_COMMUNITY)
Admission: EM | Admit: 2016-03-31 | Discharge: 2016-03-31 | Disposition: A | Discharge status: Home / Self Care | Payer: No Typology Code available for payment source | Attending: Emergency Medicine | Admitting: Emergency Medicine

## 2016-03-31 ENCOUNTER — Encounter (HOSPITAL_COMMUNITY): Payer: Self-pay | Admitting: Emergency Medicine

## 2016-03-31 ENCOUNTER — Emergency Department (HOSPITAL_COMMUNITY): Payer: No Typology Code available for payment source

## 2016-03-31 DIAGNOSIS — H578 Other specified disorders of eye and adnexa: Secondary | ICD-10-CM | POA: Insufficient documentation

## 2016-03-31 DIAGNOSIS — H5789 Other specified disorders of eye and adnexa: Secondary | ICD-10-CM

## 2016-03-31 DIAGNOSIS — M79644 Pain in right finger(s): Secondary | ICD-10-CM | POA: Insufficient documentation

## 2016-03-31 MED ORDER — OLOPATADINE HCL 0.2 % OP SOLN: 1.0000 [drp] | Freq: Every day | OPHTHALMIC | Status: AC

## 2016-03-31 MED ORDER — FLUORESCEIN SODIUM 1 MG OP STRP
1.0000 | ORAL_STRIP | Freq: Once | OPHTHALMIC | Status: AC
  Administered 2016-03-31: 1 via OPHTHALMIC
  Filled 2016-03-31: qty 1

## 2016-03-31 MED ORDER — TETRACAINE HCL 0.5 % OP SOLN
2.0000 [drp] | Freq: Once | OPHTHALMIC | Status: AC
  Administered 2016-03-31: 2 [drp] via OPHTHALMIC
  Filled 2016-03-31: qty 4

## 2016-03-31 MED ORDER — NAPROXEN 500 MG PO TABS: 500.0000 mg | ORAL_TABLET | Freq: Two times a day (BID) | ORAL | Status: DC

## 2016-03-31 MED ORDER — KETOROLAC TROMETHAMINE 60 MG/2ML IM SOLN
60.0000 mg | Freq: Once | INTRAMUSCULAR | Status: AC
  Administered 2016-03-31: 60 mg via INTRAMUSCULAR
  Filled 2016-03-31: qty 2

## 2016-03-31 NOTE — ED Provider Notes (Signed)
CSN: [id number]     Arrival date & time 03/31/16  1239 History  By signing my name below, I, Andre Weiss, attest that this documentation has been prepared under the direction and in the presence of Andre Waldrop, PA-C. Electronically Signed: Tanda Weiss, ED Scribe. 03/31/2016. 3:24 PM.   Chief Complaint  Patient presents with  . Hand Pain  . Eye Pain   The history is provided by the patient. No language interpreter was used.    HPI Comments: Andre Weiss is a 45 y.o. male who presents to the Emergency Department complaining of constant right 5th finger pain and swelling that began yesterday. The pain is sharp and shooting. It is located from his PIP to his DIP. The swelling is most prominent over the PIP joint. Denies known injury to the finger but states "I use my hands a lot so maybe I did, I don't know". Denies redness or warmth of the finger. Pt was taking a friend's Vicodin without relief. He has never had similar joint swelling in the past. He does mention a history of trauma to the digit with intermittent pain since but not as severe as today. No hx gout or other rheumatological disease.   Pt also complains of gradual onset, constant, right eye irritation and redness that began yesterday. He describes the sensation as having something stuck in his eye. He mentions having photophobia as well. Denies crusting or discharge. No symptoms in the left eye. No direct trauma to the eye. Pt states his symptoms feel similar to previous pink eye. No known sick contacts. He has been applying Visine eye drops with mild relief. Denies fever, chills, visual changes, URI symptoms or any other associated symptoms.   History reviewed. No pertinent past medical history. History reviewed. No pertinent past surgical history. History reviewed. No pertinent family history. Social History  Substance Use Topics  . Smoking status: Never Smoker   . Smokeless tobacco: None  . Alcohol Use: No    Review  of Systems  Constitutional: Negative for fever and chills.  Eyes: Positive for photophobia, pain and redness. Negative for visual disturbance.  Musculoskeletal: Positive for joint swelling and arthralgias.  All other systems reviewed and are negative.  Allergies  Review of patient's allergies indicates no known allergies.  Home Medications   Prior to Admission medications   Medication Sig Start Date End Date Taking? Authorizing Provider  erythromycin ophthalmic ointment Place a 1/2 inch ribbon of ointment into the lower eyelid 4 times a day for 5-7 days. Patient not taking: Reported on 03/02/2016 12/09/15   Cheri Fowler, PA-C  HYDROcodone-acetaminophen (NORCO) 5-325 MG tablet Take 1 tablet by mouth every 6 (six) hours as needed for severe pain. 03/02/16   Mercedes Camprubi-Soms, PA-C  naproxen (NAPROSYN) 500 MG tablet Take 1 tablet (500 mg total) by mouth 2 (two) times daily as needed for mild pain, moderate pain or headache (TAKE WITH MEALS.). 03/02/16   Mercedes Camprubi-Soms, PA-C  naproxen (NAPROSYN) 500 MG tablet Take 1 tablet (500 mg total) by mouth 2 (two) times daily. 03/31/16   Theodore Rahrig, PA-C  Olopatadine HCl 0.2 % SOLN Apply 1 drop to eye daily. 03/31/16   Steffani Dionisio, PA-C   BP 136/87 mmHg  Pulse 113  Temp(Src) 98.5 F (36.9 C) (Oral)  Resp 20  Ht 5\' 11"  (1.803 m)  Wt 117.935 kg  BMI 36.28 kg/m2  SpO2 100%   Physical Exam  Constitutional: He appears well-developed and well-nourished. No distress.  HENT:  Head: Normocephalic and atraumatic.  Eyes: EOM are normal. Pupils are equal, round, and reactive to light. Right eye exhibits no discharge. Left eye exhibits no discharge. Right conjunctiva is injected. No scleral icterus.  Slit lamp exam:      The right eye shows no corneal abrasion, no corneal flare, no foreign body and no fluorescein uptake.  Right conjunctival injection noted. No crusting or purulent discharge. Some increased watering in the eye. EOM intact without  pain. PERRL. No periorbital erythema. No corneal abrasion on fluorescein exam.     Visual Acuity  Right Eye Distance: 20/50 Left Eye Distance: 20/40 Bilateral Distance: 20/30    Neck: Normal range of motion.  Cardiovascular: Normal rate, regular rhythm and intact distal pulses.   Pulmonary/Chest: Effort normal. No respiratory distress.  Musculoskeletal: Normal range of motion.       Right hand: He exhibits tenderness and swelling. He exhibits normal range of motion, normal capillary refill and no deformity. Normal sensation noted.       Hands: TTP over PIP, middle phalanx and DIP. Moderate swelling of this area when compared to the left fifth digit. No warmth or erythema. No fluctuance or drainage. FROM of the fifth digit intact. sensation to light touch intact. Cap refill < 2 seconds.  Neurological: He is alert. Coordination normal.  Skin: Skin is warm and dry.  Psychiatric: He has a normal mood and affect. His behavior is normal.  Nursing note and vitals reviewed.   ED Course  Procedures (including critical care time)  DIAGNOSTIC STUDIES: Oxygen Saturation is 96% on RA, normal by my interpretation.    COORDINATION OF CARE: 3:23 PM-Discussed treatment plan which includes fluorescein strip test with pt at bedside and pt agreed to plan.   Labs Review Labs Reviewed - No data to display  Imaging Review Dg Finger Little Right  03/31/2016  CLINICAL DATA:  Pain and swelling of the right little finger for 2 days. No known injury. EXAM: RIGHT LITTLE FINGER 2+V COMPARISON:  None. FINDINGS: There are severe arthritic changes of the IP joints of the little finger with soft tissue swelling. Several small erosions. The adjacent joints appear normal. 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 Less likely this could represent gout. Electronically Signed   By: Francene Boyers M.D.   On: 03/31/2016 13:20   I have  personally reviewed and evaluated these images as part of my medical decision-making.   EKG Interpretation None      MDM   Final diagnoses:  Finger pain, right  Eye redness   45 year old male presenting with right fifth digit pain and right eye redness and irritation x 1 day. Afebrile. Mildly tachycardic; chart review shows pt's HR documented in low 100s at multiple previous visits. Right fifth digit with tenderness and swelling. FROM intact. No erythema or warmth suggesting septic joint. Xray of the finger shows severe arthritis of the IP joints likely post-traumatic. This is consistent with pt's history of finger injury. Right eye with injected conjunctiva without purulent discharge. No corneal abrasions or dendritic staining with fluorescein study. Non-painful EOM without concern for entrapment. Presentation non-concerning for iritis, bacterial conjunctivitis, corneal abrasions, HSV, pre-septal or orbital cellulitis. No antibiotics are indicated and patient will be prescribed pataday for irrtation.  Personal hygiene and frequent handwashing discussed.  Patient advised to followup with ophthalmologist if symptoms persist or worsen in any way including vision change or purulent discharge. Pt given  splint, NSAIDs and PCP follow up for his finger pain. Pt repeatedly requesting narcotic pain medication; discussed with pt that we will not be providing narcotics and he should follow up with PCP as planned. Patient verbalizes understanding and is agreeable with discharge.  I personally performed the services described in this documentation, which was scribed in my presence. The recorded information has been reviewed and is accurate.      Alveta HeimlichStevi Kilynn Fitzsimmons, PA-C 03/31/16 1636  Maia PlanJoshua G Long, MD 03/31/16 [phone]

## 2016-03-31 NOTE — Discharge Instructions (Signed)
Follow up with a primary care provider as discussed with the case manager today.   Viral Conjunctivitis Viral conjunctivitis is an inflammation of the clear membrane that covers the white part of your eye and the inner surface of your eyelid (conjunctiva). The inflammation is caused by a viral infection. The blood vessels in the conjunctiva become inflamed, causing the eye to become red or pink, and often itchy. Viral conjunctivitis can easily be passed from one person to another (contagious). CAUSES  Viral conjunctivitis is caused by a virus. A virus is a type of contagious germ. It can be spread by touching objects that have been contaminated with the virus, such as doorknobs or towels.  SYMPTOMS  Symptoms of viral conjunctivitis may include:   Eye redness.  Tearing or watery eyes.  Itchy eyes.  Burning feeling in the eyes.  Clear drainage from the eye.  Swollen eyelids.  A gritty feeling in the eye.  Light sensitivity. DIAGNOSIS  Viral conjunctivitis may be diagnosed with a medical history and physical exam. If you have discharge from your eye, the discharge may be tested to rule out other causes of conjunctivitis.  TREATMENT  Viral conjunctivitis does not respond to medicines that kill bacteria (antibiotics). Treatment for viral conjunctivitis is directed at stopping a bacterial infection from developing in addition to the viral infection. Treatment also aims to relieve your symptoms, such as itching. This may be done with antihistamine drops or other eye medicines. HOME CARE INSTRUCTIONS  Take medicines only as directed by your health care provider.  Avoid touching or rubbing your eyes.  Apply a warm, clean washcloth to your eye for 10-20 minutes, 3-4 times per day.  If you wear contact lenses, do not wear them until the inflammation is gone and your health care provider says it is safe to wear them again. Ask your health care provider how to sterilize or replace your contact  lenses before using them again. Wear glasses until you can resume wearing contacts.  Avoid wearing eye makeup until the inflammation is gone. Throw away any old eye cosmetics that may be contaminated.  Change or wash your pillowcase every day.  Do not share towels or washcloths. This may spread the infection.  Wash your hands often with soap and water. Use paper towels to dry your hands.  Gently wipe away any drainage from your eye with a warm, wet washcloth or a cotton ball.  Be very careful to avoid touching the edge of the eyelid with the eye drop bottle or ointment tube when applying medicines to the affected eye. This will stop you from spreading the infection to the other eye or to other people. SEEK MEDICAL CARE IF:   Your symptoms do not improve with treatment.  You have increased pain.  Your vision becomes blurry.  You have a fever.  You have facial pain, redness, or swelling.  You have new symptoms.  Your symptoms get worse.   This information is not intended to replace advice given to you by your health care provider. Make sure you discuss any questions you have with your health care provider.   Document Released: 11/29/2002 Document Revised: 03/02/2006 Document Reviewed: 06/20/2014 Elsevier Interactive Patient Education 2016 Elsevier Inc. Joint Pain Joint pain, which is also called arthralgia, can be caused by many things. Joint pain often goes away when you follow your health care provider's instructions for relieving pain at home. However, joint pain can also be caused by conditions that require further treatment.  Common causes of joint pain include:  Bruising in the area of the joint.  Overuse of the joint.  Wear and tear on the joints that occur with aging (osteoarthritis).  Various other forms of arthritis.  A buildup of a crystal form of uric acid in the joint (gout).  Infections of the joint (septic arthritis) or of the bone (osteomyelitis). Your  health care provider may recommend medicine to help with the pain. If your joint pain continues, additional tests may be needed to diagnose your condition. HOME CARE INSTRUCTIONS Watch your condition for any changes. Follow these instructions as directed to lessen the pain that you are feeling.  Take medicines only as directed by your health care provider.  Rest the affected area for as long as your health care provider says that you should. If directed to do so, raise the painful joint above the level of your heart while you are sitting or lying down.  Do not do things that cause or worsen pain.  If directed, apply ice to the painful area:  Put ice in a plastic bag.  Place a towel between your skin and the bag.  Leave the ice on for 20 minutes, 2-3 times per day.  Wear an elastic bandage, splint, or sling as directed by your health care provider. Loosen the elastic bandage or splint if your fingers or toes become numb and tingle, or if they turn cold and blue.  Begin exercising or stretching the affected area as directed by your health care provider. Ask your health care provider what types of exercise are safe for you.  Keep all follow-up visits as directed by your health care provider. This is important. SEEK MEDICAL CARE IF:  Your pain increases, and medicine does not help.  Your joint pain does not improve within 3 days.  You have increased bruising or swelling.  You have a fever.  You lose 10 lb (4.5 kg) or more without trying. SEEK IMMEDIATE MEDICAL CARE IF:  You are not able to move the joint.  Your fingers or toes become numb or they turn cold and blue.   This information is not intended to replace advice given to you by your health care provider. Make sure you discuss any questions you have with your health care provider.   Document Released: 09/08/2005 Document Revised: 09/29/2014 Document Reviewed: 06/20/2014 Elsevier Interactive Patient Education [employer].

## 2016-03-31 NOTE — Progress Notes (Addendum)
[facility] Now needs to renew an orange card at [facility] on [address]  CM spoke with pt who [place]   CM discussed and provided written information to assist pt with determining choice for uninsured accepting pcps, discussed the importance of pcp vs EDP services for f/u care, www.needymeds.org, www.goodrx.com, discounted pharmacies and other [place] resources such as [facility] , [facility], affordable care act, financial assistance, uninsured dental services, [place] med assist, DSS and  health department  Reviewed resources for [place] uninsured accepting pcps like [facility], [facility] [address], [facility], [facility], local urgent care centers, [facility], [facility], general medical clinics, [facility], [facility] plus others, medication resources, [facility] out patient pharmacies and housing Pt voiced understanding and appreciation of resources provided   Provided [facility] contact information Pt agreed to a referral Cm completed referral Pt to be contact by [facility] clinical liason   Entered in d/c instructions Medicine, [facility] Please use the resources provided to you in emergency room by case manager to assist you're your choice of doctor for follow up [address] [place] [id number] [phone] [facility] If immediate follow up services is needed Please call one of the doctors listed on the first two pages A referral for you has been sent to [facility] if you have not received a call in 3 days you may contact them Call Scherry RanKaren Andrianos at [phone] Tuesday-Friday [email or web address]

## 2016-03-31 NOTE — ED Notes (Addendum)
Pt c/o swelling to right fifth finger x 1 day. Pt able to move finger and denies injury. Pt also reports right eye pain x 1 day. Pt states "it might be that pink eye that I had before."

## 2016-05-05 ENCOUNTER — Emergency Department (HOSPITAL_COMMUNITY): Payer: Self-pay

## 2016-05-05 ENCOUNTER — Encounter (HOSPITAL_COMMUNITY): Payer: Self-pay

## 2016-05-05 ENCOUNTER — Emergency Department (HOSPITAL_COMMUNITY)
Admission: EM | Admit: 2016-05-05 | Discharge: 2016-05-05 | Disposition: A | Discharge status: Home / Self Care | Payer: Self-pay | Attending: Emergency Medicine | Admitting: Emergency Medicine

## 2016-05-05 DIAGNOSIS — Z791 Long term (current) use of non-steroidal anti-inflammatories (NSAID): Secondary | ICD-10-CM | POA: Insufficient documentation

## 2016-05-05 DIAGNOSIS — S42401A Unspecified fracture of lower end of right humerus, initial encounter for closed fracture: Secondary | ICD-10-CM

## 2016-05-05 DIAGNOSIS — Y999 Unspecified external cause status: Secondary | ICD-10-CM | POA: Insufficient documentation

## 2016-05-05 DIAGNOSIS — S52121A Displaced fracture of head of right radius, initial encounter for closed fracture: Secondary | ICD-10-CM | POA: Insufficient documentation

## 2016-05-05 DIAGNOSIS — Y9389 Activity, other specified: Secondary | ICD-10-CM | POA: Insufficient documentation

## 2016-05-05 DIAGNOSIS — Y929 Unspecified place or not applicable: Secondary | ICD-10-CM | POA: Insufficient documentation

## 2016-05-05 DIAGNOSIS — Z87891 Personal history of nicotine dependence: Secondary | ICD-10-CM | POA: Insufficient documentation

## 2016-05-05 DIAGNOSIS — X58XXXA Exposure to other specified factors, initial encounter: Secondary | ICD-10-CM | POA: Insufficient documentation

## 2016-05-05 MED ORDER — OXYCODONE-ACETAMINOPHEN 5-325 MG PO TABS: 1.0000 | ORAL_TABLET | Freq: Three times a day (TID) | ORAL | 0 refills | Status: DC | PRN

## 2016-05-05 MED ORDER — IBUPROFEN 800 MG PO TABS: 800.0000 mg | ORAL_TABLET | Freq: Three times a day (TID) | ORAL | 0 refills | Status: DC

## 2016-05-05 MED ORDER — OXYCODONE-ACETAMINOPHEN 5-325 MG PO TABS
2.0000 | ORAL_TABLET | Freq: Once | ORAL | Status: AC
  Administered 2016-05-05: 2 via ORAL
  Filled 2016-05-05: qty 2

## 2016-05-05 NOTE — ED Provider Notes (Signed)
[facility]-EMERGENCY DEPT Provider Note   CSN: [id number] Arrival date & time: 05/05/16  [id number]     History   Chief Complaint Chief Complaint  Patient presents with  . Elbow Pain    HPI Andre FannyJames E Mulvey Jr. is a 45 y.o. male.  HPI   Patient is a 45 year old male presents emergency department for evaluation of right elbow pain which began 3 days ago. He states that he was pressing down on something forcefully and afterwards began having gradually increasing pain and swelling in his right elbow.  He is tried Tylenol, ibuprofen, heat and ice at home but is current to need to worsen gradually. He states that he cannot extend his arm straight although way but is able to bend it and pain is worse with any movement or palpation.  Pain is rated 10 out of 10, achy and throbbing.  He is experiencing intermittent tingling and his middle finger but denies any numbness or weakness.  No fever, chills, sweats, nausea or vomiting. No other associated symptoms.  History reviewed. No pertinent past medical history.  There are no active problems to display for this patient.   History reviewed. No pertinent surgical history.     Home Medications    Prior to Admission medications   Medication Sig Start Date End Date Taking? Authorizing Provider  erythromycin ophthalmic ointment Place a 1/2 inch ribbon of ointment into the lower eyelid 4 times a day for 5-7 days. Patient not taking: Reported on 03/02/2016 12/09/15   Cheri FowlerKayla Rose, PA-C  HYDROcodone-acetaminophen (NORCO) 5-325 MG tablet Take 1 tablet by mouth every 6 (six) hours as needed for severe pain. 03/02/16   Mercedes Camprubi-Soms, PA-C  ibuprofen (ADVIL,MOTRIN) 800 MG tablet Take 1 tablet (800 mg total) by mouth 3 (three) times daily. 05/05/16   Danelle BerryLeisa Baylen Dea, PA-C  naproxen (NAPROSYN) 500 MG tablet Take 1 tablet (500 mg total) by mouth 2 (two) times daily as needed for mild pain, moderate pain or headache (TAKE WITH MEALS.). 03/02/16   Mercedes  Camprubi-Soms, PA-C  naproxen (NAPROSYN) 500 MG tablet Take 1 tablet (500 mg total) by mouth 2 (two) times daily. 03/31/16   Stevi Barrett, PA-C  Olopatadine HCl 0.2 % SOLN Apply 1 drop to eye daily. 03/31/16   Stevi Barrett, PA-C  oxyCODONE-acetaminophen (PERCOCET) 5-325 MG tablet Take 1-2 tablets by mouth every 8 (eight) hours as needed for severe pain. 05/05/16   Danelle BerryLeisa Itza Maniaci, PA-C    Family History History reviewed. No pertinent family history.  Social History Social History  Substance Use Topics  . Smoking status: Former Games developermoker  . Smokeless tobacco: Never Used  . Alcohol use No     Allergies   Review of patient's allergies indicates no known allergies.   Review of Systems Review of Systems  All other systems reviewed and are negative.    Physical Exam Updated Vital Signs BP 150/80 (BP Location: Left Arm)   Pulse 117   Temp 99.9 F (37.7 C) (Oral)   Resp 20   SpO2 96%   Physical Exam  Constitutional: He is oriented to person, place, and time. He appears well-developed and well-nourished. No distress.  HENT:  Head: Normocephalic and atraumatic.  Nose: Nose normal.  Eyes: Conjunctivae are normal. Pupils are equal, round, and reactive to light. Right eye exhibits no discharge. Left eye exhibits no discharge.  Neck: Normal range of motion. No tracheal deviation present.  Cardiovascular: Normal rate, regular rhythm and intact distal pulses.   Pulmonary/Chest: Effort normal. No  respiratory distress.  Abdominal: He exhibits no distension.  Musculoskeletal: He exhibits edema and tenderness. He exhibits no deformity.       Right elbow: He exhibits decreased range of motion, swelling and effusion. He exhibits no deformity and no laceration. Tenderness found. Radial head, lateral epicondyle and olecranon process tenderness noted. No medial epicondyle tenderness noted.       Right wrist: Normal.  Normal sensation to light touch in right arm all nerve distributions    Neurological: He is alert and oriented to person, place, and time. He has normal strength. No sensory deficit.  Skin: Skin is warm and dry. Capillary refill takes less than 2 seconds. No rash noted. He is not diaphoretic. No pallor.  Psychiatric: He has a normal mood and affect. His behavior is normal. Judgment and thought content normal.     ED Treatments / Results  Labs (all labs ordered are listed, but only abnormal results are displayed) Labs Reviewed - No data to display  EKG  EKG Interpretation None       Radiology Dg Elbow Complete Right  Result Date: 05/05/2016 CLINICAL DATA:  Elbow injury while working on car with increasing pain and decreased range of motion, initial encounter EXAM: RIGHT ELBOW - COMPLETE 3+ VIEW COMPARISON:  None. FINDINGS: There is elevation the anterior posterior fat pad identified. Soft tissue swelling is noted posteriorly over the olecranon. Irregularity of the radial head is noted consistent with a mildly displaced radial head fracture. Degenerative changes of the articulation of the distal humerus and proximal ulna are seen as well. IMPRESSION: Changes consistent with radial head fracture an joint effusion. Electronically Signed   By: Alcide CleverMark  Lukens M.D.   On: 05/05/2016 07:59    Procedures Procedures (including critical care time)  Medications Ordered in ED Medications  oxyCODONE-acetaminophen (PERCOCET/ROXICET) 5-325 MG per tablet 2 tablet (2 tablets Oral Given 05/05/16 [id number])     Initial Impression / Assessment and Plan / ED Course  I have reviewed the triage vital signs and the nursing notes.  Pertinent labs & imaging results that were available during my care of the patient were reviewed by me and considered in my medical decision making (see chart for details).  Clinical Course  Patient with right elbow pain with x-ray significant for radial head fracture with joint effusion. Dr. Orlan Leavensrtman, on call for hand, was contacted regarding wishes for  follow-up. He reviewed films and had his staff returned my call stating that patient is fine to follow up next week in his office. Patient was placed in a posterior long-arm splint and placed in sling. On exam his neurovascularly intact prior to splinting. He was given pain medications and NSAIDs. Return precautions reviewed.  Final Clinical Impressions(s) / ED Diagnoses   Final diagnoses:  Closed fracture of right elbow, initial encounter    New Prescriptions New Prescriptions   IBUPROFEN (ADVIL,MOTRIN) 800 MG TABLET    Take 1 tablet (800 mg total) by mouth 3 (three) times daily.   OXYCODONE-ACETAMINOPHEN (PERCOCET) 5-325 MG TABLET    Take 1-2 tablets by mouth every 8 (eight) hours as needed for severe pain.     Danelle BerryLeisa Everette Mall, PA-C 05/05/16 [id number]    Melene Planan Floyd, DO 05/05/16 1141

## 2016-05-05 NOTE — ED Triage Notes (Addendum)
Pt c/o R elbow pain and decreased ROM x 3 days.  Pain score 10/10.  Pt reports taking Tylenol w/o relief.  Pt reports that he was working on his car when the pain began.  Swelling noted.

## 2016-05-20 ENCOUNTER — Encounter (HOSPITAL_COMMUNITY): Payer: Self-pay | Admitting: Emergency Medicine

## 2016-05-20 ENCOUNTER — Emergency Department (HOSPITAL_COMMUNITY)
Admission: EM | Admit: 2016-05-20 | Discharge: 2016-05-20 | Disposition: A | Discharge status: Home / Self Care | Payer: Self-pay | Attending: Emergency Medicine | Admitting: Emergency Medicine

## 2016-05-20 ENCOUNTER — Emergency Department (HOSPITAL_COMMUNITY): Payer: Self-pay

## 2016-05-20 DIAGNOSIS — Z791 Long term (current) use of non-steroidal anti-inflammatories (NSAID): Secondary | ICD-10-CM | POA: Insufficient documentation

## 2016-05-20 DIAGNOSIS — Z87891 Personal history of nicotine dependence: Secondary | ICD-10-CM | POA: Insufficient documentation

## 2016-05-20 DIAGNOSIS — M79644 Pain in right finger(s): Secondary | ICD-10-CM | POA: Insufficient documentation

## 2016-05-20 DIAGNOSIS — M79601 Pain in right arm: Secondary | ICD-10-CM | POA: Insufficient documentation

## 2016-05-20 MED ORDER — OXYCODONE-ACETAMINOPHEN 5-325 MG PO TABS
1.0000 | ORAL_TABLET | Freq: Once | ORAL | Status: AC
  Administered 2016-05-20: 1 via ORAL
  Filled 2016-05-20: qty 1

## 2016-05-20 MED ORDER — OXYCODONE-ACETAMINOPHEN 5-325 MG PO TABS: 1.0000 | ORAL_TABLET | Freq: Three times a day (TID) | ORAL | 0 refills | Status: DC | PRN

## 2016-05-20 NOTE — Discharge Instructions (Addendum)
Please read and follow all provided instructions.  Your diagnoses today include:  1. Finger pain, right   2. Right arm pain     Tests performed today include: Vital signs. See below for your results today.   Medications prescribed:  Take as prescribed   You can use Ibuprofen 400mg  combined with Tylenol 1000mg  for pain relief every 6 hours. Do not exceed 4g of Tylenol in one 24 hour period. Use narcotics if pain uncontrolled with the aforementioned regiment. Do not exceed 10 days of this regiment.   Home care instructions:  Follow any educational materials contained in this packet.  Follow-up instructions: Please follow-up with orthopedics for further evaluation of symptoms and treatment   Return instructions:  Please return to the Emergency Department if you do not get better, if you get worse, or new symptoms OR  - Fever (temperature greater than 101.57F)  - Bleeding that does not stop with holding pressure to the area    -Severe pain (please note that you may be more sore the day after your accident)  - Chest Pain  - Difficulty breathing  - Severe nausea or vomiting  - Inability to tolerate food and liquids  - Passing out  - Skin becoming red around your wounds  - Change in mental status (confusion or lethargy)  - New numbness or weakness    Please return if you have any other emergent concerns.  Additional Information:  Your vital signs today were: BP 133/90 (BP Location: Left Arm)    Pulse 100    Temp 98.1 F (36.7 C) (Oral)    Resp 18    Ht 6' (1.829 m)    Wt 117.9 kg    SpO2 94%    BMI 35.26 kg/m  If your blood pressure (BP) was elevated above 135/85 this visit, please have this repeated by your doctor within one month. ---------------

## 2016-05-20 NOTE — ED Provider Notes (Signed)
[facility]-EMERGENCY DEPT Provider Note   CSN: [id number] Arrival date & time: 05/20/16  1317  By signing my name below, I, Javier Docker, attest that this documentation has been prepared under the direction and in the presence of Audry Pili, PA-C. Electronically Signed: Javier Docker, ER Scribe. 05/03/2016. 1:59 PM.   History   Chief Complaint Chief Complaint  Patient presents with  . Arm Pain  . Edema    Finger    The history is provided by the patient. No language interpreter was used.    HPI Comments: Andre Anctil. is a 45 y.o. male who presents to the Emergency Department complaining of right fifth finger warmth, swelling and pain. He cannot remember injuring his finger, but believes he may have injured it. He states his pain is 10/10 with pressure on the finger. He was seen on 05/05/16 and diagnosed with radial head fracture and told to f/o with Dr. Orlan Leavens. He has not called Dr. Bari Edward office.  Today is complaining of right fifth finger warmth, swelling and pain. He has no past hx of gout.    History reviewed. No pertinent past medical history.  There are no active problems to display for this patient.   History reviewed. No pertinent surgical history.   Home Medications    Prior to Admission medications   Medication Sig Start Date End Date Taking? Authorizing Provider  erythromycin ophthalmic ointment Place a 1/2 inch ribbon of ointment into the lower eyelid 4 times a day for 5-7 days. Patient not taking: Reported on 03/02/2016 12/09/15   Cheri Fowler, PA-C  HYDROcodone-acetaminophen (NORCO) 5-325 MG tablet Take 1 tablet by mouth every 6 (six) hours as needed for severe pain. 03/02/16   Mercedes Camprubi-Soms, PA-C  ibuprofen (ADVIL,MOTRIN) 800 MG tablet Take 1 tablet (800 mg total) by mouth 3 (three) times daily. 05/05/16   Danelle Berry, PA-C  naproxen (NAPROSYN) 500 MG tablet Take 1 tablet (500 mg total) by mouth 2 (two) times daily as needed for mild pain,  moderate pain or headache (TAKE WITH MEALS.). 03/02/16   Mercedes Camprubi-Soms, PA-C  naproxen (NAPROSYN) 500 MG tablet Take 1 tablet (500 mg total) by mouth 2 (two) times daily. 03/31/16   Stevi Barrett, PA-C  Olopatadine HCl 0.2 % SOLN Apply 1 drop to eye daily. 03/31/16   Stevi Barrett, PA-C  oxyCODONE-acetaminophen (PERCOCET) 5-325 MG tablet Take 1-2 tablets by mouth every 8 (eight) hours as needed for severe pain. 05/05/16   Danelle Berry, PA-C    Family History No family history on file.  Social History Social History  Substance Use Topics  . Smoking status: Former Games developer  . Smokeless tobacco: Never Used  . Alcohol use No     Allergies   Review of patient's allergies indicates no known allergies.   Review of Systems Review of Systems  Constitutional: Negative for chills and unexpected weight change.  Musculoskeletal: Positive for arthralgias and joint swelling.  Neurological: Negative for weakness and numbness.    Physical Exam Updated Vital Signs BP 133/90 (BP Location: Left Arm)   Pulse 100   Temp 98.1 F (36.7 C) (Oral)   Resp 18   Ht 6' (1.829 m)   Wt 260 lb (117.9 kg)   SpO2 94%   BMI 35.26 kg/m   Physical Exam  Constitutional: He is oriented to person, place, and time. Vital signs are normal. He appears well-developed and well-nourished. No distress.  HENT:  Head: Normocephalic and atraumatic.  Right Ear: Hearing  normal.  Left Ear: Hearing normal.  Eyes: Conjunctivae and EOM are normal. Pupils are equal, round, and reactive to light.  Neck: Neck supple.  Cardiovascular: Normal rate and regular rhythm.   Pulmonary/Chest: Effort normal. No respiratory distress.  Musculoskeletal: Normal range of motion.  Pt with left arm splint in place. NVI. Noted proximal 5th digit swelling with minimal erythema. TTP with limited ROM due to pain.   Neurological: He is alert and oriented to person, place, and time. Coordination normal.  Skin: Skin is warm and dry. He is not  diaphoretic.  Psychiatric: He has a normal mood and affect. His speech is normal and behavior is normal. Thought content normal.  Nursing note and vitals reviewed.  ED Treatments / Results  DIAGNOSTIC STUDIES: Oxygen Saturation is 94% on RA, adequate by my interpretation.    COORDINATION OF CARE: 1:59 PM Discussed treatment plan with pt at bedside which includes xray and encouragement to f/u with Dr. Bari Edward office pt agreed to plan.  Labs (all labs ordered are listed, but only abnormal results are displayed) Labs Reviewed - No data to display  EKG  EKG Interpretation None       Radiology Dg Finger Little Right  Result Date: 05/20/2016 CLINICAL DATA:  Soft tissue swelling.  Uncertain trauma history EXAM: RIGHT FIFTH FINGER 2+V COMPARISON:  None. FINDINGS: Frontal, oblique, and lateral views were obtained. There is moderate generalized soft tissue swelling. There is extensive osteoarthritic change in the fifth PIP and DIP joints. No acute fracture or dislocation evident. There is a small erosion along the lateral aspect in the third PIP joint. IMPRESSION: No acute fracture or dislocation. Generalized soft tissue swelling. 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 Electronically Signed   By: Bretta Bang III M.D.   On: 05/20/2016 14:31    Procedures Procedures (including critical care time)  Medications Ordered in ED Medications - No data to display   Initial Impression / Assessment and Plan / ED Course  I have reviewed the triage vital signs and the nursing notes.  Pertinent labs & imaging results that were available during my care of the patient were reviewed by me and considered in my medical decision making (see chart for details).  Clinical Course    Final Clinical Impressions(s) / ED Diagnoses  I have reviewed and evaluated the relevant imaging studies.  I have reviewed the relevant previous  healthcare records. I obtained HPI from historian.  ED Course:  Assessment: Pt is a 45yF who presents with right arm pain with 5th digit swelling. Pt recently seen for radial head fracture. DCed home with sling and follow up to [facility] (Dr. Orlan Leavens), which patient has not followed up with. Noted 5th digit swelling yesterday. Pain on proximal aspect with ROM painful. Slight erythema.. On exam, pt in NAD. Nontoxic/nonseptic appearing. VSS. Afebrile. Changed splint in ED. Swelling possible related to new trauma to area vs gout flair? Imaging with no acute fracture. Review of previous imaging of 5th digit with traumatic arthritis. No fracture. Plan is to DC home with follow up to Ortho. Reviewed [place] controlled substances database and he was given prescription of percocet #20, five day supply. At time of discharge, Patient is in no acute distress. Vital Signs are stable. Patient is able to ambulate. Patient able to tolerate PO.    Disposition/Plan:  DC Home Additional Verbal discharge instructions given and discussed with patient.  Pt Instructed to f/u  with Ortho in the next week for evaluation and treatment of symptoms. Return precautions given Pt acknowledges and agrees with plan  Supervising Physician Geoffery Lyonsouglas Delo, MD   Final diagnoses:  Finger pain, right  Right arm pain    New Prescriptions New Prescriptions   No medications on file    I personally performed the services described in this documentation, which was scribed in my presence. The recorded information has been reviewed and is accurate.    Audry Piliyler Ezzard Ditmer, PA-C 05/20/16 1436    Geoffery Lyonsouglas Delo, MD 05/20/16 [phone]

## 2016-05-20 NOTE — ED Triage Notes (Signed)
Patient states he was seen here 2 weeks ago and dx with a fracture in the right arm. Patient is now having right 5th finger swelling/pain. Motor and sensory intact.
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
## 2018-04-23 ENCOUNTER — Encounter (HOSPITAL_COMMUNITY): Payer: Self-pay | Admitting: Obstetrics and Gynecology

## 2018-04-23 ENCOUNTER — Emergency Department (HOSPITAL_COMMUNITY): Payer: No Typology Code available for payment source

## 2018-04-23 ENCOUNTER — Emergency Department (HOSPITAL_COMMUNITY)
Admission: EM | Admit: 2018-04-23 | Discharge: 2018-04-23 | Disposition: A | Discharge status: Home / Self Care | Payer: No Typology Code available for payment source | Attending: Emergency Medicine | Admitting: Emergency Medicine

## 2018-04-23 DIAGNOSIS — Z87891 Personal history of nicotine dependence: Secondary | ICD-10-CM | POA: Insufficient documentation

## 2018-04-23 DIAGNOSIS — Y999 Unspecified external cause status: Secondary | ICD-10-CM | POA: Insufficient documentation

## 2018-04-23 DIAGNOSIS — Y9389 Activity, other specified: Secondary | ICD-10-CM | POA: Insufficient documentation

## 2018-04-23 DIAGNOSIS — Y92410 Unspecified street and highway as the place of occurrence of the external cause: Secondary | ICD-10-CM | POA: Insufficient documentation

## 2018-04-23 DIAGNOSIS — M545 Low back pain: Secondary | ICD-10-CM | POA: Insufficient documentation

## 2018-04-23 DIAGNOSIS — M5489 Other dorsalgia: Secondary | ICD-10-CM | POA: Diagnosis present

## 2018-04-23 MED ORDER — METHOCARBAMOL 500 MG PO TABS: 500.0000 mg | ORAL_TABLET | Freq: Two times a day (BID) | ORAL | 0 refills | Status: DC

## 2018-04-23 MED ORDER — IBUPROFEN 200 MG PO TABS
600.0000 mg | ORAL_TABLET | Freq: Once | ORAL | Status: AC
  Administered 2018-04-23: 600 mg via ORAL
  Filled 2018-04-23: qty 3
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
## 2018-04-23 NOTE — Discharge Instructions (Addendum)
Expect your soreness to increase over the next 2-3 days. Take it easy, but do not lay around too much as this may make any stiffness worse.  °Antiinflammatory medications: Take 600 mg of ibuprofen every 6 hours or 440 mg (over the counter dose) to 500 mg (prescription dose) of naproxen every 12 hours for the next 3 days. After this time, these medications may be used as needed for pain. Take these medications with food to avoid upset stomach. Choose only one of these medications, do not take them together.  °Tylenol: Should you continue to have additional pain while taking the ibuprofen or naproxen, you may add in tylenol as needed. Your daily total maximum amount of tylenol from all sources should be limited to 4000mg/day for persons without liver problems, or 2000mg/day for those with liver problems. °Muscle relaxer: Robaxin is a muscle relaxer and may help loosen stiff muscles. Do not take the Robaxin while driving or performing other dangerous activities.  °Lidocaine patches: These are available via either prescription or over-the-counter. The over-the-counter option may be more economical one and are likely just as effective. There are multiple over-the-counter brands, such as Salonpas. °Exercises: Be sure to perform the attached exercises starting with three times a week and working up to performing them daily. This is an essential part of preventing long term problems.  °Follow up: Follow up with a primary care provider for any future management of these complaints. Be sure to follow up within 7-10 days. °Return: Return to the ED should symptoms worsen. °
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
## 2021-09-23 ENCOUNTER — Emergency Department (HOSPITAL_COMMUNITY): Payer: No Typology Code available for payment source

## 2021-09-23 ENCOUNTER — Encounter (HOSPITAL_COMMUNITY): Payer: Self-pay

## 2021-09-23 ENCOUNTER — Emergency Department (HOSPITAL_COMMUNITY)
Admission: EM | Admit: 2021-09-23 | Discharge: 2021-09-23 | Disposition: A | Discharge status: Home / Self Care | Payer: No Typology Code available for payment source | Attending: Emergency Medicine | Admitting: Emergency Medicine

## 2021-09-23 ENCOUNTER — Other Ambulatory Visit: Payer: Self-pay

## 2021-09-23 DIAGNOSIS — Y92410 Unspecified street and highway as the place of occurrence of the external cause: Secondary | ICD-10-CM | POA: Diagnosis not present

## 2021-09-23 DIAGNOSIS — M546 Pain in thoracic spine: Secondary | ICD-10-CM | POA: Insufficient documentation

## 2021-09-23 DIAGNOSIS — M25512 Pain in left shoulder: Secondary | ICD-10-CM | POA: Diagnosis present

## 2021-09-23 DIAGNOSIS — M545 Low back pain, unspecified: Secondary | ICD-10-CM | POA: Insufficient documentation

## 2021-09-23 MED ORDER — IBUPROFEN 800 MG PO TABS
800.0000 mg | ORAL_TABLET | Freq: Once | ORAL | Status: AC
  Administered 2021-09-23: 800 mg via ORAL
  Filled 2021-09-23: qty 1

## 2021-09-23 NOTE — ED Notes (Signed)
Pt was requesting a neck brace upon d/c. I informed the pt that the provider has not ordered one and we cannot provide neck braces due to not being indicated.
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
## 2021-09-23 NOTE — Discharge Instructions (Signed)
No evidence of broken bones are noted on your x-rays Please use ibuprofen up to 800 mg every 6-8 hours for 1 to 3 days. Additionally, you can use acetaminophen, hot and cold therapy. Return if you are having any new symptoms or appear worse anytime

## 2022-05-02 ENCOUNTER — Emergency Department (HOSPITAL_COMMUNITY)
Admission: EM | Admit: 2022-05-02 | Discharge: 2022-05-02 | Disposition: A | Discharge status: Home / Self Care | Payer: No Typology Code available for payment source | Attending: Emergency Medicine | Admitting: Emergency Medicine

## 2022-05-02 ENCOUNTER — Other Ambulatory Visit: Payer: Self-pay

## 2022-05-02 ENCOUNTER — Emergency Department (HOSPITAL_COMMUNITY): Payer: No Typology Code available for payment source

## 2022-05-02 ENCOUNTER — Encounter (HOSPITAL_COMMUNITY): Payer: Self-pay

## 2022-05-02 DIAGNOSIS — Y92828 Other wilderness area as the place of occurrence of the external cause: Secondary | ICD-10-CM | POA: Insufficient documentation

## 2022-05-02 DIAGNOSIS — S50811A Abrasion of right forearm, initial encounter: Secondary | ICD-10-CM | POA: Insufficient documentation

## 2022-05-02 DIAGNOSIS — S80211A Abrasion, right knee, initial encounter: Secondary | ICD-10-CM | POA: Insufficient documentation

## 2022-05-02 DIAGNOSIS — Y9355 Activity, bike riding: Secondary | ICD-10-CM | POA: Insufficient documentation

## 2022-05-02 DIAGNOSIS — S50312A Abrasion of left elbow, initial encounter: Secondary | ICD-10-CM | POA: Insufficient documentation

## 2022-05-02 DIAGNOSIS — S0081XA Abrasion of other part of head, initial encounter: Secondary | ICD-10-CM | POA: Insufficient documentation

## 2022-05-02 DIAGNOSIS — T07XXXA Unspecified multiple injuries, initial encounter: Secondary | ICD-10-CM

## 2022-05-02 DIAGNOSIS — S40211A Abrasion of right shoulder, initial encounter: Secondary | ICD-10-CM | POA: Insufficient documentation

## 2022-05-02 MED ORDER — HYDROCODONE-ACETAMINOPHEN 5-325 MG PO TABS
1.0000 | ORAL_TABLET | Freq: Once | ORAL | Status: AC
  Administered 2022-05-02: 1 via ORAL
  Filled 2022-05-02: qty 1

## 2022-05-02 MED ORDER — BACITRACIN ZINC 500 UNIT/GM EX OINT
TOPICAL_OINTMENT | Freq: Two times a day (BID) | CUTANEOUS | Status: DC
  Administered 2022-05-02: 7 via TOPICAL
  Filled 2022-05-02: qty 9

## 2022-05-02 MED ORDER — HYDROCODONE-ACETAMINOPHEN 5-325 MG PO TABS: 2.0000 | ORAL_TABLET | Freq: Four times a day (QID) | ORAL | 0 refills | Status: AC | PRN

## 2022-05-02 MED ORDER — BACITRACIN ZINC 500 UNIT/GM EX OINT: 1.0000 | TOPICAL_OINTMENT | Freq: Two times a day (BID) | CUTANEOUS | 0 refills | Status: AC
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
## 2022-05-02 NOTE — ED Notes (Signed)
Wounds cleaned and non-stick dressings applied
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
## 2024-01-05 ENCOUNTER — Other Ambulatory Visit: Payer: Self-pay

## 2024-01-05 ENCOUNTER — Emergency Department (HOSPITAL_COMMUNITY)
Admission: EM | Admit: 2024-01-05 | Discharge: 2024-01-05 | Disposition: A | Discharge status: Home / Self Care | Attending: Emergency Medicine | Admitting: Emergency Medicine

## 2024-01-05 DIAGNOSIS — I10 Essential (primary) hypertension: Secondary | ICD-10-CM | POA: Insufficient documentation

## 2024-01-05 DIAGNOSIS — L0291 Cutaneous abscess, unspecified: Secondary | ICD-10-CM

## 2024-01-05 DIAGNOSIS — H40052 Ocular hypertension, left eye: Secondary | ICD-10-CM | POA: Insufficient documentation

## 2024-01-05 DIAGNOSIS — L02411 Cutaneous abscess of right axilla: Secondary | ICD-10-CM | POA: Insufficient documentation

## 2024-01-05 DIAGNOSIS — Z79899 Other long term (current) drug therapy: Secondary | ICD-10-CM | POA: Insufficient documentation

## 2024-01-05 MED ORDER — DOXYCYCLINE HYCLATE 100 MG PO CAPS: 100.0000 mg | ORAL_CAPSULE | Freq: Two times a day (BID) | ORAL | 0 refills | Status: AC

## 2024-01-05 MED ORDER — DORZOLAMIDE HCL-TIMOLOL MAL 2-0.5 % OP SOLN: 1.0000 [drp] | Freq: Two times a day (BID) | OPHTHALMIC | 0 refills | Status: AC

## 2024-01-05 MED ORDER — TETRACAINE HCL 0.5 % OP SOLN
2.0000 [drp] | Freq: Once | OPHTHALMIC | Status: AC
  Administered 2024-01-05: 2 [drp] via OPHTHALMIC
  Filled 2024-01-05: qty 4

## 2024-01-05 MED ORDER — AMLODIPINE BESYLATE 5 MG PO TABS: 5.0000 mg | ORAL_TABLET | Freq: Every day | ORAL | 0 refills | Status: AC

## 2024-01-05 MED ORDER — FLUORESCEIN SODIUM 1 MG OP STRP
1.0000 | ORAL_STRIP | Freq: Once | OPHTHALMIC | Status: AC
  Administered 2024-01-05: 1 via OPHTHALMIC
  Filled 2024-01-05: qty 1

## 2024-01-05 MED ORDER — OFLOXACIN 0.3 % OP SOLN: 1.0000 [drp] | Freq: Four times a day (QID) | OPHTHALMIC | 0 refills | Status: AC

## 2024-01-05 NOTE — ED Notes (Signed)
 Patient d/c with home care instructions.

## 2024-01-05 NOTE — ED Triage Notes (Signed)
 Pt reports left eye pain that started yesterday. No drainage. No trauma/injury. Has take OTC eye drops with some relief.   Pt also reports concern for right axillary abscess 2-3 days ago. No fevers/chills. Has had similar abscess in the past that resolved with PO ABX.

## 2024-01-05 NOTE — Discharge Instructions (Signed)

## 2024-01-05 NOTE — ED Provider Notes (Signed)
 [facility] EMERGENCY DEPARTMENT AT [facility] Provider Note   CSN: [id number] Arrival date & time: 01/05/24  1926     History  Chief Complaint  Patient presents with   Eye Pain    L   Abscess    R Axilla     Andre Weiss. is a 53 y.o. male.  Patient is a 53 year old who presents with pain in his left eye.  He says he feels like it is pinkeye.  He has had it before.  It started about 2 days ago.  It has some watery discharge and crusting in the morning.  It is a little blurry in the morning when he first wakes up but clears throughout the day.  No other vision changes.  It has been red.  He denies any rashes or facial swelling around the eye.  No fevers.  He has a little bit of rhinorrhea which he attributes to pollen.  He also has developed an abscess in his right axilla.  He has had similar symptoms in the past.  It started few days ago.  He said in the past it has resolved with antibiotics.       Home Medications Prior to Admission medications   Medication Sig Start Date End Date Taking? Authorizing Provider  amLODipine (NORVASC) 5 MG tablet Take 1 tablet (5 mg total) by mouth daily. 01/05/24 02/04/24 Yes Rolan Bucco, MD  dorzolamide-timolol (COSOPT) 2-0.5 % ophthalmic solution Place 1 drop into the left eye 2 (two) times daily. 01/05/24  Yes Rolan Bucco, MD  doxycycline (VIBRAMYCIN) 100 MG capsule Take 1 capsule (100 mg total) by mouth 2 (two) times daily. 01/05/24  Yes Rolan Bucco, MD  ofloxacin (OCUFLOX) 0.3 % ophthalmic solution Place 1 drop into the left eye 4 (four) times daily for 7 days. 01/05/24 01/12/24 Yes Rolan Bucco, MD  bacitracin ointment Apply 1 Application topically 2 (two) times daily. 05/02/22   Peter Garter, PA  HYDROcodone-acetaminophen (NORCO/VICODIN) 5-325 MG tablet Take 2 tablets by mouth every 6 (six) hours as needed. 05/02/22   Sherian Maroon A, PA  Olopatadine HCl 0.2 % SOLN Apply 1 drop to eye daily. Patient not taking:  Reported on 04/23/2018 03/31/16   Barrett, Rolm Gala, PA-C      Allergies    Patient has no known allergies.    Review of Systems   Review of Systems  Constitutional:  Negative for fever.  HENT:  Positive for rhinorrhea.   Eyes:  Positive for pain, discharge and redness. Negative for photophobia and visual disturbance.  Respiratory:  Negative for cough and shortness of breath.   Cardiovascular:  Negative for chest pain.  Gastrointestinal:  Negative for nausea and vomiting.  Musculoskeletal:  Negative for neck pain and neck stiffness.  Skin:  Positive for wound.  Neurological:  Negative for headaches.    Physical Exam Updated Vital Signs BP (!) 164/106   Pulse 91   Temp 98.2 F (36.8 C) (Oral)   Resp 18   SpO2 99%  Physical Exam Constitutional:      Appearance: He is well-developed.  HENT:     Head: Normocephalic and atraumatic.  Eyes:     General:        Left eye: Discharge (Small amount of watery discharge) present.No foreign body or hordeolum.     Intraocular pressure: Right eye pressure is 30 mmHg. Left eye pressure is 59 mmHg.     Extraocular Movements: Extraocular movements intact.  Conjunctiva/sclera:     Left eye: Left conjunctiva is injected.     Comments: Positive erythema to the conjunctive on the left eye.  No drainage.  No photophobia.  No swelling around the eye.  No rash around the eye.  No abnormal uptake on fluorescein stain  Cardiovascular:     Rate and Rhythm: Normal rate.  Pulmonary:     Effort: Pulmonary effort is normal.  Musculoskeletal:        General: No tenderness.     Cervical back: Normal range of motion and neck supple.  Skin:    General: Skin is warm and dry.     Comments: 1 cm abscess in the right axilla.  There is no warmth or erythema.  No surrounding cellulitis.  Neurological:     Mental Status: He is alert and oriented to person, place, and time.     ED Results / Procedures / Treatments   Labs (all labs ordered are listed, but  only abnormal results are displayed) Labs Reviewed - No data to display  EKG None  Radiology No results found.  Procedures Procedures    Medications Ordered in ED Medications  fluorescein ophthalmic strip 1 strip (1 strip Left Eye Given 01/05/24 2110)  tetracaine (PONTOCAINE) 0.5 % ophthalmic solution 2 drop (2 drops Left Eye Given 01/05/24 2112)    ED Course/ Medical Decision Making/ A&P                                 Medical Decision Making Risk Prescription drug management.   Patient is a 53 year old who presents with eye redness and pain.  The conjunctive is injected.  He has some abnormally elevated pressure in the eye.  The right eye has mildly elevated pressure.  I do not visualize any foreign bodies.  There is no evidence of corneal abrasions.  He said it was crusting in the morning and sound suspicious for conjunctivitis.  However with the elevated pressures, I did consult with ophthalmology, Dr. Sherryll Burger.  He advises to start the patient on Cosopt eyedrops 1 drop twice daily and he will see the patient tomorrow morning at 9 AM for a more complete eye exam.  The patient states he has normal vision.  He does not wear contacts.  He is amenable to following up with ophthalmologist tomorrow.  He also has a small abscess under his right arm.  I offered I&D of this area.  However he said last time he got better with antibiotics and does not want to do the I&D at this point.  I did advise him to use hot compresses and he may have to return for an I&D if it does not get better with antibiotics.  Will start him on doxycycline.  He is also noted to have elevated blood pressure.  He is asymptomatic.  He does not have a PCP.  He is not currently on antihypertensive medication.  Will start amlodipine and put consult in for [facility] to establish primary care.  Patient was discharged home in good condition.  Will follow-up with ophthalmology tomorrow.  Return precautions were given.  Final Clinical  Impression(s) / ED Diagnoses Final diagnoses:  Uncontrolled hypertension  Ocular hypertension of left eye  Abscess    Rx / DC Orders ED Discharge Orders          Ordered    dorzolamide-timolol (COSOPT) 2-0.5 % ophthalmic solution  2 times daily  01/05/24 2246    ofloxacin (OCUFLOX) 0.3 % ophthalmic solution  4 times daily        01/05/24 2246    doxycycline (VIBRAMYCIN) 100 MG capsule  2 times daily        01/05/24 2246    amLODipine (NORVASC) 5 MG tablet  Daily        01/05/24 2246    Referral to [facility] Care Management       Comments: Pharmacy Medication Management for Uncontrolled hypertension in the ED   01/05/24 2246              Andre Los, MD 01/05/24 2308

## 2024-01-07 ENCOUNTER — Telehealth: Payer: Self-pay

## 2024-01-08 NOTE — Progress Notes (Signed)
 Complex Care Management Note Care Guide Note  01/08/2024 Name: Andre Weiss. MRN: [id number] DOB: 1971/03/30   Complex Care Management Outreach Attempts: An unsuccessful telephone outreach was attempted today to offer the patient information about available complex care management services.  Follow Up Plan:  Additional outreach attempts will be made to offer the patient complex care management information and services.   Encounter Outcome:  No Answer  Harlene Satterfield  [facility]  [facility], [facility] Guide  Direct Dial: [phone]  Fax [phone]

## 2024-01-11 NOTE — Progress Notes (Signed)
 Complex Care Management Note Care Guide Note  01/11/2024 Name: Andre Weiss. MRN: [id number] DOB: 01-02-1971   Complex Care Management Outreach Attempts: A third unsuccessful outreach was attempted today to offer the patient with information about available complex care management services.  Follow Up Plan:  No further outreach attempts will be made at this time. We have been unable to contact the patient to offer or enroll patient in complex care management services.  Encounter Outcome:  No Answer  [facility]  [facility], [facility] Care Management Assistant Direct Dial: [phone]  Fax: [phone]
